# Patient Record
Sex: Female | Born: 1940 | Race: White | Hispanic: No | Marital: Married | Smoking: Current some day smoker
Health system: Southern US, Community
[De-identification: ages and names within clinical notes are randomized; demographics above are authoritative.]

## PROBLEM LIST (undated history)

## (undated) DIAGNOSIS — E785 Hyperlipidemia, unspecified: Secondary | ICD-10-CM

## (undated) DIAGNOSIS — N186 End stage renal disease: Secondary | ICD-10-CM

## (undated) DIAGNOSIS — J439 Emphysema, unspecified: Secondary | ICD-10-CM

## (undated) DIAGNOSIS — I629 Nontraumatic intracranial hemorrhage, unspecified: Secondary | ICD-10-CM

## (undated) DIAGNOSIS — I779 Disorder of arteries and arterioles, unspecified: Secondary | ICD-10-CM

## (undated) DIAGNOSIS — I5042 Chronic combined systolic (congestive) and diastolic (congestive) heart failure: Secondary | ICD-10-CM

## (undated) DIAGNOSIS — F0390 Unspecified dementia without behavioral disturbance: Secondary | ICD-10-CM

## (undated) DIAGNOSIS — F419 Anxiety disorder, unspecified: Secondary | ICD-10-CM

## (undated) DIAGNOSIS — J449 Chronic obstructive pulmonary disease, unspecified: Secondary | ICD-10-CM

## (undated) DIAGNOSIS — I639 Cerebral infarction, unspecified: Secondary | ICD-10-CM

## (undated) DIAGNOSIS — I272 Pulmonary hypertension, unspecified: Secondary | ICD-10-CM

## (undated) DIAGNOSIS — F32A Depression, unspecified: Secondary | ICD-10-CM

## (undated) DIAGNOSIS — I10 Essential (primary) hypertension: Secondary | ICD-10-CM

## (undated) DIAGNOSIS — E039 Hypothyroidism, unspecified: Secondary | ICD-10-CM

## (undated) DIAGNOSIS — I671 Cerebral aneurysm, nonruptured: Secondary | ICD-10-CM

## (undated) DIAGNOSIS — G9389 Other specified disorders of brain: Secondary | ICD-10-CM

## (undated) HISTORY — PX: CEREBRAL ANEURYSM REPAIR: SHX164

---

## 2020-01-13 ENCOUNTER — Inpatient Hospital Stay
Admission: EM | Admit: 2020-01-13 | Payer: Self-pay | Source: Other Acute Inpatient Hospital | Admitting: Family Medicine

## 2021-09-20 ENCOUNTER — Emergency Department (HOSPITAL_COMMUNITY): Payer: Medicare Other

## 2021-09-20 ENCOUNTER — Inpatient Hospital Stay (HOSPITAL_COMMUNITY)
Admission: EM | Admit: 2021-09-20 | Discharge: 2021-09-28 | DRG: 956 | Disposition: A | Discharge status: Home Health | Payer: Medicare Other | Attending: Internal Medicine | Admitting: Internal Medicine

## 2021-09-20 ENCOUNTER — Other Ambulatory Visit: Payer: Self-pay

## 2021-09-20 DIAGNOSIS — F0394 Unspecified dementia, unspecified severity, with anxiety: Secondary | ICD-10-CM | POA: Diagnosis present

## 2021-09-20 DIAGNOSIS — J439 Emphysema, unspecified: Secondary | ICD-10-CM | POA: Diagnosis present

## 2021-09-20 DIAGNOSIS — R9431 Abnormal electrocardiogram [ECG] [EKG]: Secondary | ICD-10-CM | POA: Diagnosis not present

## 2021-09-20 DIAGNOSIS — M25551 Pain in right hip: Secondary | ICD-10-CM | POA: Diagnosis present

## 2021-09-20 DIAGNOSIS — F32A Depression, unspecified: Secondary | ICD-10-CM | POA: Diagnosis present

## 2021-09-20 DIAGNOSIS — Z8249 Family history of ischemic heart disease and other diseases of the circulatory system: Secondary | ICD-10-CM

## 2021-09-20 DIAGNOSIS — R778 Other specified abnormalities of plasma proteins: Secondary | ICD-10-CM | POA: Diagnosis present

## 2021-09-20 DIAGNOSIS — G9341 Metabolic encephalopathy: Secondary | ICD-10-CM | POA: Diagnosis not present

## 2021-09-20 DIAGNOSIS — I132 Hypertensive heart and chronic kidney disease with heart failure and with stage 5 chronic kidney disease, or end stage renal disease: Secondary | ICD-10-CM | POA: Diagnosis present

## 2021-09-20 DIAGNOSIS — Z515 Encounter for palliative care: Secondary | ICD-10-CM | POA: Diagnosis not present

## 2021-09-20 DIAGNOSIS — I10 Essential (primary) hypertension: Secondary | ICD-10-CM | POA: Diagnosis present

## 2021-09-20 DIAGNOSIS — S72011A Unspecified intracapsular fracture of right femur, initial encounter for closed fracture: Principal | ICD-10-CM

## 2021-09-20 DIAGNOSIS — Z8679 Personal history of other diseases of the circulatory system: Secondary | ICD-10-CM

## 2021-09-20 DIAGNOSIS — F411 Generalized anxiety disorder: Secondary | ICD-10-CM | POA: Diagnosis present

## 2021-09-20 DIAGNOSIS — I5022 Chronic systolic (congestive) heart failure: Secondary | ICD-10-CM | POA: Diagnosis present

## 2021-09-20 DIAGNOSIS — Y9301 Activity, walking, marching and hiking: Secondary | ICD-10-CM | POA: Diagnosis present

## 2021-09-20 DIAGNOSIS — D631 Anemia in chronic kidney disease: Secondary | ICD-10-CM | POA: Diagnosis not present

## 2021-09-20 DIAGNOSIS — J9621 Acute and chronic respiratory failure with hypoxia: Secondary | ICD-10-CM | POA: Diagnosis present

## 2021-09-20 DIAGNOSIS — S066XAA Traumatic subarachnoid hemorrhage with loss of consciousness status unknown, initial encounter: Secondary | ICD-10-CM | POA: Diagnosis present

## 2021-09-20 DIAGNOSIS — E039 Hypothyroidism, unspecified: Secondary | ICD-10-CM | POA: Diagnosis present

## 2021-09-20 DIAGNOSIS — S72001A Fracture of unspecified part of neck of right femur, initial encounter for closed fracture: Secondary | ICD-10-CM | POA: Diagnosis present

## 2021-09-20 DIAGNOSIS — I739 Peripheral vascular disease, unspecified: Secondary | ICD-10-CM | POA: Diagnosis not present

## 2021-09-20 DIAGNOSIS — Z01810 Encounter for preprocedural cardiovascular examination: Secondary | ICD-10-CM

## 2021-09-20 DIAGNOSIS — I5042 Chronic combined systolic (congestive) and diastolic (congestive) heart failure: Secondary | ICD-10-CM | POA: Diagnosis present

## 2021-09-20 DIAGNOSIS — W010XXA Fall on same level from slipping, tripping and stumbling without subsequent striking against object, initial encounter: Secondary | ICD-10-CM | POA: Diagnosis present

## 2021-09-20 DIAGNOSIS — Z9181 History of falling: Secondary | ICD-10-CM

## 2021-09-20 DIAGNOSIS — I609 Nontraumatic subarachnoid hemorrhage, unspecified: Secondary | ICD-10-CM

## 2021-09-20 DIAGNOSIS — E785 Hyperlipidemia, unspecified: Secondary | ICD-10-CM | POA: Diagnosis present

## 2021-09-20 DIAGNOSIS — Z8673 Personal history of transient ischemic attack (TIA), and cerebral infarction without residual deficits: Secondary | ICD-10-CM

## 2021-09-20 DIAGNOSIS — I110 Hypertensive heart disease with heart failure: Secondary | ICD-10-CM | POA: Diagnosis not present

## 2021-09-20 DIAGNOSIS — Z20822 Contact with and (suspected) exposure to covid-19: Secondary | ICD-10-CM | POA: Diagnosis not present

## 2021-09-20 DIAGNOSIS — I272 Pulmonary hypertension, unspecified: Secondary | ICD-10-CM | POA: Diagnosis present

## 2021-09-20 DIAGNOSIS — J449 Chronic obstructive pulmonary disease, unspecified: Secondary | ICD-10-CM | POA: Diagnosis present

## 2021-09-20 DIAGNOSIS — I248 Other forms of acute ischemic heart disease: Secondary | ICD-10-CM | POA: Diagnosis not present

## 2021-09-20 DIAGNOSIS — W19XXXA Unspecified fall, initial encounter: Secondary | ICD-10-CM

## 2021-09-20 DIAGNOSIS — S72001D Fracture of unspecified part of neck of right femur, subsequent encounter for closed fracture with routine healing: Secondary | ICD-10-CM | POA: Diagnosis not present

## 2021-09-20 DIAGNOSIS — S01111A Laceration without foreign body of right eyelid and periocular area, initial encounter: Secondary | ICD-10-CM | POA: Diagnosis present

## 2021-09-20 DIAGNOSIS — Z7189 Other specified counseling: Secondary | ICD-10-CM | POA: Diagnosis not present

## 2021-09-20 DIAGNOSIS — D72829 Elevated white blood cell count, unspecified: Secondary | ICD-10-CM | POA: Diagnosis present

## 2021-09-20 DIAGNOSIS — G40909 Epilepsy, unspecified, not intractable, without status epilepticus: Secondary | ICD-10-CM | POA: Diagnosis present

## 2021-09-20 DIAGNOSIS — F0390 Unspecified dementia without behavioral disturbance: Secondary | ICD-10-CM | POA: Diagnosis not present

## 2021-09-20 DIAGNOSIS — Y92009 Unspecified place in unspecified non-institutional (private) residence as the place of occurrence of the external cause: Secondary | ICD-10-CM

## 2021-09-20 DIAGNOSIS — I7123 Aneurysm of the descending thoracic aorta, without rupture: Secondary | ICD-10-CM | POA: Diagnosis present

## 2021-09-20 DIAGNOSIS — E889 Metabolic disorder, unspecified: Secondary | ICD-10-CM | POA: Diagnosis present

## 2021-09-20 DIAGNOSIS — Z992 Dependence on renal dialysis: Secondary | ICD-10-CM

## 2021-09-20 DIAGNOSIS — G9389 Other specified disorders of brain: Secondary | ICD-10-CM | POA: Diagnosis present

## 2021-09-20 DIAGNOSIS — E782 Mixed hyperlipidemia: Secondary | ICD-10-CM | POA: Diagnosis not present

## 2021-09-20 DIAGNOSIS — Z720 Tobacco use: Secondary | ICD-10-CM | POA: Diagnosis present

## 2021-09-20 DIAGNOSIS — Z01818 Encounter for other preprocedural examination: Secondary | ICD-10-CM | POA: Diagnosis not present

## 2021-09-20 DIAGNOSIS — I509 Heart failure, unspecified: Secondary | ICD-10-CM | POA: Diagnosis not present

## 2021-09-20 DIAGNOSIS — F17210 Nicotine dependence, cigarettes, uncomplicated: Secondary | ICD-10-CM | POA: Diagnosis present

## 2021-09-20 DIAGNOSIS — I712 Thoracic aortic aneurysm, without rupture, unspecified: Secondary | ICD-10-CM | POA: Diagnosis not present

## 2021-09-20 DIAGNOSIS — N186 End stage renal disease: Secondary | ICD-10-CM | POA: Diagnosis present

## 2021-09-20 DIAGNOSIS — I719 Aortic aneurysm of unspecified site, without rupture: Secondary | ICD-10-CM | POA: Diagnosis present

## 2021-09-20 DIAGNOSIS — R569 Unspecified convulsions: Secondary | ICD-10-CM | POA: Diagnosis not present

## 2021-09-20 HISTORY — DX: End stage renal disease: N18.6

## 2021-09-20 HISTORY — DX: Emphysema, unspecified: J43.9

## 2021-09-20 HISTORY — DX: Hypothyroidism, unspecified: E03.9

## 2021-09-20 HISTORY — DX: Depression, unspecified: F32.A

## 2021-09-20 HISTORY — DX: Cerebral infarction, unspecified: I63.9

## 2021-09-20 HISTORY — DX: Hyperlipidemia, unspecified: E78.5

## 2021-09-20 HISTORY — DX: Cerebral aneurysm, nonruptured: I67.1

## 2021-09-20 HISTORY — DX: Chronic combined systolic (congestive) and diastolic (congestive) heart failure: I50.42

## 2021-09-20 HISTORY — DX: Anxiety disorder, unspecified: F41.9

## 2021-09-20 HISTORY — DX: Disorder of arteries and arterioles, unspecified: I77.9

## 2021-09-20 HISTORY — DX: Unspecified dementia, unspecified severity, without behavioral disturbance, psychotic disturbance, mood disturbance, and anxiety: F03.90

## 2021-09-20 HISTORY — DX: Chronic obstructive pulmonary disease, unspecified: J44.9

## 2021-09-20 HISTORY — DX: Essential (primary) hypertension: I10

## 2021-09-20 HISTORY — DX: Nontraumatic intracranial hemorrhage, unspecified: I62.9

## 2021-09-20 HISTORY — DX: Pulmonary hypertension, unspecified: I27.20

## 2021-09-20 HISTORY — DX: Other specified disorders of brain: G93.89

## 2021-09-20 LAB — CBC
HCT: 34.2 % — ABNORMAL LOW (ref 36.0–46.0)
HCT: 33.2 % — ABNORMAL LOW (ref 36.0–46.0)
Hemoglobin: 11.1 g/dL — ABNORMAL LOW (ref 12.0–15.0)
Hemoglobin: 10.7 g/dL — ABNORMAL LOW (ref 12.0–15.0)
MCH: 31.2 pg (ref 26.0–34.0)
MCH: 31.5 pg (ref 26.0–34.0)
MCHC: 32.5 g/dL (ref 30.0–36.0)
MCHC: 32.2 g/dL (ref 30.0–36.0)
MCV: 96.1 fL (ref 80.0–100.0)
MCV: 97.6 fL (ref 80.0–100.0)
Platelets: 186 10*3/uL (ref 150–400)
Platelets: 174 10*3/uL (ref 150–400)
RBC: 3.56 MIL/uL — ABNORMAL LOW (ref 3.87–5.11)
RBC: 3.4 MIL/uL — ABNORMAL LOW (ref 3.87–5.11)
RDW: 14.4 % (ref 11.5–15.5)
RDW: 14.3 % (ref 11.5–15.5)
WBC: 10.2 10*3/uL (ref 4.0–10.5)
WBC: 15.2 10*3/uL — ABNORMAL HIGH (ref 4.0–10.5)
nRBC: 0 % (ref 0.0–0.2)
nRBC: 0 % (ref 0.0–0.2)

## 2021-09-20 LAB — SAMPLE TO BLOOD BANK

## 2021-09-20 LAB — I-STAT CHEM 8, ED
BUN: 22 mg/dL (ref 8–23)
Calcium, Ion: 1.08 mmol/L — ABNORMAL LOW (ref 1.15–1.40)
Chloride: 102 mmol/L (ref 98–111)
Creatinine, Ser: 3.8 mg/dL — ABNORMAL HIGH (ref 0.44–1.00)
Glucose, Bld: 95 mg/dL (ref 70–99)
HCT: 35 % — ABNORMAL LOW (ref 36.0–46.0)
Hemoglobin: 11.9 g/dL — ABNORMAL LOW (ref 12.0–15.0)
Potassium: 4.8 mmol/L (ref 3.5–5.1)
Sodium: 140 mmol/L (ref 135–145)
TCO2: 30 mmol/L (ref 22–32)

## 2021-09-20 LAB — MAGNESIUM: Magnesium: 1.9 mg/dL (ref 1.7–2.4)

## 2021-09-20 LAB — RESP PANEL BY RT-PCR (FLU A&B, COVID) ARPGX2
Influenza A by PCR: NEGATIVE
Influenza B by PCR: NEGATIVE
SARS Coronavirus 2 by RT PCR: NEGATIVE

## 2021-09-20 LAB — DIFFERENTIAL
Abs Immature Granulocytes: 0.1 10*3/uL — ABNORMAL HIGH (ref 0.00–0.07)
Basophils Absolute: 0.1 10*3/uL (ref 0.0–0.1)
Basophils Relative: 0 %
Eosinophils Absolute: 0.1 10*3/uL (ref 0.0–0.5)
Eosinophils Relative: 1 %
Immature Granulocytes: 1 %
Lymphocytes Relative: 3 %
Lymphs Abs: 0.4 10*3/uL — ABNORMAL LOW (ref 0.7–4.0)
Monocytes Absolute: 0.7 10*3/uL (ref 0.1–1.0)
Monocytes Relative: 5 %
Neutro Abs: 13.9 10*3/uL — ABNORMAL HIGH (ref 1.7–7.7)
Neutrophils Relative %: 90 %

## 2021-09-20 LAB — PHOSPHORUS: Phosphorus: 4.1 mg/dL (ref 2.5–4.6)

## 2021-09-20 LAB — TROPONIN I (HIGH SENSITIVITY): Troponin I (High Sensitivity): 39 ng/L — ABNORMAL HIGH (ref <18)

## 2021-09-20 LAB — COMPREHENSIVE METABOLIC PANEL
ALT: 12 U/L (ref 0–44)
AST: 36 U/L (ref 15–41)
Albumin: 3.3 g/dL — ABNORMAL LOW (ref 3.5–5.0)
Alkaline Phosphatase: 93 U/L (ref 38–126)
Anion gap: 11 (ref 5–15)
BUN: 17 mg/dL (ref 8–23)
CO2: 26 mmol/L (ref 22–32)
Calcium: 9 mg/dL (ref 8.9–10.3)
Chloride: 103 mmol/L (ref 98–111)
Creatinine, Ser: 3.84 mg/dL — ABNORMAL HIGH (ref 0.44–1.00)
GFR, Estimated: 11 mL/min — ABNORMAL LOW (ref >60)
Glucose, Bld: 93 mg/dL (ref 70–99)
Potassium: 4.9 mmol/L (ref 3.5–5.1)
Sodium: 140 mmol/L (ref 135–145)
Total Bilirubin: 1.1 mg/dL (ref 0.3–1.2)
Total Protein: 6 g/dL — ABNORMAL LOW (ref 6.5–8.1)

## 2021-09-20 LAB — PROTIME-INR
INR: 1.2 (ref 0.8–1.2)
Prothrombin Time: 15 seconds (ref 11.4–15.2)

## 2021-09-20 LAB — CK: Total CK: 19 U/L — ABNORMAL LOW (ref 38–234)

## 2021-09-20 LAB — ETHANOL: Alcohol, Ethyl (B): <10 mg/dL (ref <10)

## 2021-09-20 LAB — LACTIC ACID, PLASMA: Lactic Acid, Venous: 1.1 mmol/L (ref 0.5–1.9)

## 2021-09-20 MED ORDER — LIDOCAINE HCL (PF) 1 % IJ SOLN
30.0000 mL | Freq: Once | INTRAMUSCULAR | Status: AC
  Administered 2021-09-20: 30 mL via INTRADERMAL
  Filled 2021-09-20: qty 30

## 2021-09-20 MED ORDER — IOHEXOL 300 MG/ML  SOLN
100.0000 mL | Freq: Once | INTRAMUSCULAR | Status: AC | PRN
Start: 2021-09-20 — End: 2021-09-20
  Administered 2021-09-20: 100 mL via INTRAVENOUS

## 2021-09-20 MED ORDER — ONDANSETRON HCL 4 MG/2ML IJ SOLN
4.0000 mg | Freq: Once | INTRAMUSCULAR | Status: AC
Start: 2021-09-20 — End: 2021-09-20
  Administered 2021-09-20: 4 mg via INTRAVENOUS
  Filled 2021-09-20: qty 2

## 2021-09-20 MED ORDER — MORPHINE SULFATE (PF) 2 MG/ML IV SOLN
2.0000 mg | INTRAVENOUS | Status: DC | PRN
  Administered 2021-09-21: 2 mg via INTRAVENOUS
  Filled 2021-09-20: qty 1

## 2021-09-20 MED ORDER — MORPHINE SULFATE (PF) 4 MG/ML IV SOLN
4.0000 mg | Freq: Once | INTRAVENOUS | Status: AC
  Administered 2021-09-20: 4 mg via INTRAVENOUS
  Filled 2021-09-20: qty 1

## 2021-09-20 NOTE — Progress Notes (Signed)
Received call regarding this patient with ground level fall. Patient with right femoral neck fracture. Will require arthroplasty surgery. NPO after midnight. Hospitalist admission. Plan for surgery tomorrow pending surgeon and OR availability. ? ?Shona Needles, MD ?[facility] ?[phone] (office) ?[email or web address] ? ?

## 2021-09-20 NOTE — Progress Notes (Signed)
Orthopedic Tech Progress Note ?Patient Details:  ?Katherine Ponce ?Oct 22, 1940 ?[id number] ? ?Level 2 trauma  ? ?Patient ID: Katherine Ponce, female   DOB: 1940-10-27, 81 y.o.   MRN: [id number] ? ?Katherine Ponce ?09/20/2021, 6:36 PM ? ?

## 2021-09-20 NOTE — H&P (View-Only) (Signed)
Received call regarding this patient with ground level fall. Patient with right femoral neck fracture. Will require arthroplasty surgery. NPO after midnight. Hospitalist admission. Plan for surgery tomorrow pending surgeon and OR availability. ? ?Shona Needles, MD ?[facility] ?[phone] (office) ?[email or web address] ? ?

## 2021-09-20 NOTE — ED Triage Notes (Signed)
Pt BIB [facility] from home as fall-Level 2 with Head injury. The neighbor went to her home & saw husband tending to her in the floor, unknown down time or if any LOC. Head injury apparent with Lac to Rt temporal area & Skin tear to the Right FA. Pt has Hx of dementia but husband reports she can usually carry on a full conversation, she was lethargic while in route to ED. EMS reports c-collar in place, initial BP 150 palpated, then 198/100, 20g PIV in Lt AC started & 4mg  Zofran given d/t n/v x1 occurrence. Dialysis pt- Fistula in Rt arm (goes on Tue, Thur & Sat). Pt denies any pain. ?

## 2021-09-20 NOTE — Subjective & Objective (Signed)
Pt with dementia had a fall with head injury ?The neighbor went to her home & saw husband tending to her in the floor, unknown down time or if any LOC. Head injury apparent with Lac to Rt temporal area & Skin tear to the Right FA. Pt has Hx of dementia but husband reports she can usually carry on a full conversation, she was lethargic  ?Pt is on HD goes T H Sat ?

## 2021-09-20 NOTE — ED Provider Notes (Signed)
?[facility] ?Provider Note ? ?CSN: [id number] ?Arrival date & time: 09/20/21 1800 ? ?Chief Complaint(s) ?No chief complaint on file. ? ?HPI ?Katherine Ponce is a 81 y.o. female who presents emergency department as a level 2 trauma for a fall from standing with external evidence of head trauma.  Patient is not on a blood thinner.  Patient arrives with bruising to bilateral orbits, a laceration to the lateral aspect of the right orbit and complaints of right hip pain.  Additional history obtained from patient's family who states that the patient attempted to maneuver around a chair and tripped and struck her face on the ground.  Patient alert and answering questions on arrival.  Hemodynamically stable. ? ?HPI ? ?Past Medical History ?No past medical history on file. ?There are no problems to display for this patient. ? ?Home Medication(s) ?Prior to Admission medications   ?Not on File  ?                                                                                                                                  ?Past Surgical History ? ?Family History ?No family history on file. ? ?Social History ?  ?Allergies ?Patient has no allergy information on record. ? ?Review of Systems ?Review of Systems  ?HENT:  Positive for facial swelling.   ?Musculoskeletal:  Positive for arthralgias.  ?Skin:  Positive for wound.  ? ?Physical Exam ?Vital Signs  ?I have reviewed the triage vital signs ?There were no vitals taken for this visit. ? ?Physical Exam ?Vitals and nursing note reviewed.  ?Constitutional:   ?   General: She is not in acute distress. ?   Appearance: She is well-developed.  ?HENT:  ?   Head: Normocephalic.  ?   Comments: 1 cm laceration to the right lateral orbit, bilateral orbital bruising ?Eyes:  ?   Conjunctiva/sclera: Conjunctivae normal.  ?Cardiovascular:  ?   Rate and Rhythm: Normal rate and regular rhythm.  ?   Heart sounds: No murmur heard. ?Pulmonary:  ?   Effort:  Pulmonary effort is normal. No respiratory distress.  ?   Breath sounds: Normal breath sounds.  ?Abdominal:  ?   Palpations: Abdomen is soft.  ?   Tenderness: There is no abdominal tenderness.  ?Musculoskeletal:     ?   General: Tenderness (Right hip) and deformity present. No swelling.  ?   Cervical back: Neck supple.  ?Skin: ?   General: Skin is warm and dry.  ?   Capillary Refill: Capillary refill takes less than 2 seconds.  ?Neurological:  ?   Mental Status: She is alert.  ?Psychiatric:     ?   Mood and Affect: Mood normal.  ? ? ?ED Results and Treatments ?Labs ?(all labs ordered are listed, but only abnormal results are displayed) ?Labs Reviewed  ?RESP PANEL BY RT-PCR (FLU A&B, COVID) ARPGX2  ?COMPREHENSIVE METABOLIC PANEL  ?CBC  ?  ETHANOL  ?URINALYSIS, ROUTINE W REFLEX MICROSCOPIC  ?LACTIC ACID, PLASMA  ?PROTIME-INR  ?I-STAT CHEM 8, ED  ?SAMPLE TO BLOOD BANK  ?                                                                                                                       ? ?Radiology ?No results found. ? ?Pertinent labs & imaging results that were available during my care of the patient were reviewed by me and considered in my medical decision making (see MDM for details). ? ?Medications Ordered in ED ?Medications - No data to display                                                               ?                                                                    ?Procedures ?Marland KitchenCritical Care ?Performed by: Teressa Lower, MD ?Authorized by: Teressa Lower, MD  ? ?Critical care provider statement:  ?  Critical care time (minutes):  30 ?  Critical care was necessary to treat or prevent imminent or life-threatening deterioration of the following conditions:  Trauma ?  Critical care was time spent personally by me on the following activities:  Development of treatment plan with patient or surrogate, discussions with consultants, evaluation of patient's response to treatment, examination of patient, ordering  and review of laboratory studies, ordering and review of radiographic studies, ordering and performing treatments and interventions, pulse oximetry, re-evaluation of patient's condition and review of old charts ?Marland Kitchen.Laceration Repair ? ?Date/Time: 09/20/2021 11:26 PM ?Performed by: Teressa Lower, MD ?Authorized by: Teressa Lower, MD  ? ?Laceration details:  ?  Location:  Face ?  Face location:  R eyebrow ?  Length (cm):  1 ?Pre-procedure details:  ?  Preparation:  Patient was prepped and draped in usual sterile fashion ?Treatment:  ?  Area cleansed with:  Saline ?  Amount of cleaning:  Standard ?  Irrigation method:  Pressure wash ?Skin repair:  ?  Repair method:  Sutures ?  Suture size:  3-0 ?  Suture material:  Fast-absorbing gut ?  Suture technique:  Simple interrupted ?Approximation:  ?  Approximation:  Close ?Repair type:  ?  Repair type:  Simple ?Post-procedure details:  ?  Dressing:  Adhesive bandage ?  Procedure completion:  Tolerated well, no immediate complications ? ?(including critical care time) ? ?Medical Decision Making / ED Course ? ? ?This patient presents to the ED for concern of right hip  pain, facial trauma, this involves an extensive number of treatment options, and is a complaint that carries with it a high risk of complications and morbidity.  The differential diagnosis includes fracture, ICH, skull fracture, temporal bone fracture ? ?MDM: ?Patient seen in the emergency room for evaluation of facial trauma and right hip pain after fall.  Initial primary survey negative.  Secondary survey with bilateral orbital bruising, laceration to the right orbit, tenderness over the right hip but is otherwise unremarkable.  Laboratory evaluation with a hemoglobin of 11.1 but is otherwise unremarkable.  Trauma imaging with trace subarachnoid hemorrhage, right hip fracture as well as an incidental large descending thoracic aneurysm with no evidence of rupture.  Neurosurgery was consulted about the  subarachnoid hemorrhage and they state that this is exceptionally small and does not require follow-up imaging.  Orthopedics consulted who recommended medical admission and they will fix the hip tomorrow.  Vascular surgery consulted who evaluated the patient at bedside and states that she has a very high risk surgical candidate given her ESRD on hemodialysis and they will be reluctant to perform high risk surgery on this unless it is symptomatic.  Patient then admitted to the medical service for observation and hip fracture repair tomorrow orthopedics. ? ? ?Additional history obtained: ?-Additional history obtained from family members ?-External records from outside source obtained and reviewed including: Chart review including previous notes, labs, imaging, consultation notes ? ? ?Lab Tests: ?-I ordered, reviewed, and interpreted labs.   ?The pertinent results include:   ?Labs Reviewed  ?RESP PANEL BY RT-PCR (FLU A&B, COVID) ARPGX2  ?COMPREHENSIVE METABOLIC PANEL  ?CBC  ?ETHANOL  ?URINALYSIS, ROUTINE W REFLEX MICROSCOPIC  ?LACTIC ACID, PLASMA  ?PROTIME-INR  ?I-STAT CHEM 8, ED  ?SAMPLE TO BLOOD BANK  ?  ? ? ?EKG  ? EKG Interpretation ? ?Date/Time:  Tuesday September 20 2021 22:19:06 EDT ?Ventricular Rate:  81 ?PR Interval:  253 ?QRS Duration: 115 ?QT Interval:  417 ?QTC Calculation: 485 ?R Axis:   -8 ?Text Interpretation: Sinus rhythm Prolonged PR interval LVH with IVCD and secondary repol abnrm Anterior ST elevation, probably due to LVH Confirmed by Kyrollos Cordell ([id number]) on 09/20/2021 11:27:59 PM ?  ? ?  ? ? ? ?Imaging Studies ordered: ?I ordered imaging studies including CT head, C-spine, chest abdomen pelvis, temporal bones ?I independently visualized and interpreted imaging. ?I agree with the radiologist interpretation ? ? ?Medicines ordered and prescription drug management: ?No orders of the defined types were placed in this encounter. ?  ?-I have reviewed the patients home medicines and have made adjustments as  needed ? ?Critical interventions ?Multiple consultations, trauma resuscitation ? ?Consultations Obtained: ?I requested consultation with the orthopedic surgeon vascular surgeon neurosurgeon,  and discussed lab and imaging

## 2021-09-20 NOTE — H&P (Signed)
? ? Katherine Ponce LGX:[id number] DOB: 1940/09/08 DOA: 09/20/2021 ?  ?PCP: Kristopher Glee., MD   ?Outpatient Specialists:  ?  ?NEphrology:   Dr.Igwemezie, Lucile Crater, MD ?   ?Patient arrived to ER on 09/20/21 at 1800 ?Referred by Attending Kommor, Debe Coder, MD ? ? ?Patient coming from:   ? home Lives  With family ?   ?Chief Complaint:   ?Chief Complaint  ?Patient presents with  ? Trauma  ? Level 2  ? Head Injury  ? ? ?HPI: ?Katherine Ponce is a 81 y.o. 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   ?Presented with a mechanical  fall  ?Pt with dementia had a fall with head injury ?The neighbor went to her home & saw husband tending to her in the floor, unknown down time or if any LOC. Head injury apparent with Lac to Rt temporal area & Skin tear to the Right FA. Pt has Hx of dementia but husband reports she can usually carry on a full conversation, she was lethargic  ?Pt is on HD goes T H Sat ?  ?Per family pt supposed to walk with a walker  ?But was confused walked around it and fell down ?She smokes on occasion ?Walks with walker ?At baseline recognizes husband ?Short term memory loss ?No CP no SOB ?Unable to waLK AT BASEline to mailbox, or up the stairs ? ? Initial COVID TEST  ?NEGATIVE  ? ?Lab Results  ?Component Value Date  ? [place] NEGATIVE 09/20/2021  ? ?  ?Regarding pertinent Chronic problems:   ? ? Hyperlipidemia -  on statins Atorvastatin. ?Lipid Panel  ?No results found for: CHOL, TRIG, HDL, CHOLHDL, VLDL, LDLCALC, LDLDIRECT, LABVLDL ? ? HTN on Hydralazine, labetalol, torsemide, Nifedipine. ? ? chronic CHF diastolic/systolic/ combined - last echo  ?LV ejection fraction = 40-45%. 01/2021 ?IVC size was severely dilated.  ?  ?GAD on Sertraline 100 1 and 1/2 tablet po daily.  ?  ? ? Hypothyroidism:  on synthroid ?   ?COPD - not followed by pulmonology  not  on baseline oxygen  ?  ?Hx of CVA - with/out residual  deficits on Aspirin 81 mg,  ?  ?ESRD on HD tu th,sat ?Estimated Creatinine Clearance: 11.4 mL/min (A) (by C-G formula based on SCr of 3.8 mg/dL (H)). ? ?Lab Results  ?Component Value Date  ? CREATININE 3.80 (H) 09/20/2021  ? CREATININE 3.84 (H) 09/20/2021  ? ? ? Chronic anemia - baseline hg Hemoglobin & Hematocrit  ?Recent Labs  ?  09/20/21 ?1809 09/20/21 ?1817  ?HGB 11.1* 11.9*  ? ?While in ER: ?   ?Ordered ? ?CT HEAD Very small amount of subarachnoid blood over the right convexity. ?2. Right frontal scalp hematoma without underlying fracture. ? ?CXR - There is marked ectasia of the descending thoracic aorta which ?appears more pronounced, although this may be rotational. Recommend ?further evaluation with CT. ?2. Cardiomegaly with central pulmonary vascular congestion. ?3. Minimal left basilar atelectasis/airspace disease. ? ?CTabd/pelvis -   ? ?CTA chest - 1. 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 Recommend vascular ?consultation. ?2. Small bilateral pleural effusions. ?3. Bibasilar atelectasis. ?4. Displaced subcapital right femoral neck fracture. ?5. Cardiomegaly. ?6. Trace free fluid in the pelvis. ?7. Cholelithiasis. ?8. Mildly hyperdense renal lesions are indeterminate, possibly ?hyperdense cyst. Recommend further evaluation with ultrasound or ?MRI. ? ?  Right hip Transverse subcapital acute fracture of the right proximal femur ?with varus angulation. ? ?Following Medications were ordered in ER: ?Medications  ?iohexol (OMNIPAQUE) 300 MG/ML solution 100 mL (100 mLs Intravenous Contrast Given 09/20/21 1915)  ?morphine (PF) 4 MG/ML injection 4 mg (4 mg Intravenous Given 09/20/21 1953)  ?ondansetron [facility]) injection 4 mg (4 mg Intravenous Given 09/20/21 1949)  ?lidocaine (PF) (XYLOCAINE) 1 % injection 30 mL (30 mLs Intradermal Given 09/20/21 2055)  ?  ?_______________________________________________________ ?ER Provider Called:   vascular  Dr. Scot Dock ?They  Recommend admit to medicine  Given that the aneurysm extends to the level of the celiac axis she would not be a candidate for standard thoracic stent.  I think her only option would be a custom fenestrated graft or a open thoracoabdominal repair.  Both of these would be associated with significant risk.  If the family wished to pursue this electively we would need to refer the patient to [facility] to be evaluated by Dr. Eddie Dibbles team.  Vascular surgery will follow. ? SEEN in ER ?_______________________________________________________ ?ER Provider Called:  orthopedics    Dr. Jari Pigg ?They Recommend admit to medicine Patient with right femoral neck fracture. Will require arthroplasty surgery. NPO after midnight. Hospitalist admission. Plan for surgery tomorrow pending surgeon and OR availability. ?Will see in AM    ? ?_______________________________________________________ ?ER Provider Called:  neurosurgery     Dr.Cabbell ?They Recommend admit to medicine Head CT reviewed. There is a miniscule  amount of possible blood in the subarachnoid space. This is causing no problems. No repeat ct is needed. No further follow up is needed ?  ?  ?ED Triage Vitals  ?Enc Vitals Group  ?   BP 09/20/21 1803 (!) 194/86  ?   Pulse Rate 09/20/21 1815 70  ?   Resp 09/20/21 1815 (!) 29  ?   Temp --   ?   Temp src --   ?   SpO2 09/20/21 1815 91 %  ?   Weight 09/20/21 1816 130 lb (59 kg)  ?   Height 09/20/21 1816 5\' 6"  (1.676 m)  ?   Head Circumference --   ?   Peak Flow --   ?   Pain Score 09/20/21 1816 0  ?   Pain Loc --   ?   Pain Edu? --   ?   Excl. in [place]? --   ?VOHY(07)@    ? _________________________________________ ?Significant initial  Findings: ?Abnormal Labs Reviewed  ?COMPREHENSIVE METABOLIC PANEL - Abnormal; Notable for the following components:  ?    Result Value  ? Creatinine, Ser 3.84 (*)   ? Total Protein 6.0 (*)   ? Albumin 3.3 (*)   ? GFR, Estimated 11 (*)   ? All other components within normal limits  ?CBC - Abnormal;  Notable for the following components:  ? RBC 3.56 (*)   ? Hemoglobin 11.1 (*)   ? HCT 34.2 (*)   ? All other components within normal limits  ?I-STAT CHEM 8, ED - Abnormal; Notable for the following components:  ? Creatinine, Ser 3.80 (*)   ? Calcium, Ion 1.08 (*)   ? Hemoglobin 11.9 (*)   ? HCT 35.0 (*)   ? All other components within normal limits  ?_________________________ ?Troponin 39 ?ECG: Ordered ?Personally reviewed by me showing: ?HR :81  ?Rhythm: SR, LVH ? Anterior ST elevation  ?QTC 485 ? ?The recent clinical data is shown below. ?Vitals:  ? 09/20/21 2045 09/20/21 2100  09/20/21 2115 09/20/21 2130  ?BP: (!) 187/95 (!) 195/84 (!) 186/86 (!) 196/86  ?Pulse: 84     ?Resp: 17 20 20  (!) 24  ?SpO2: 97%     ?Weight:      ?Height:      ? ?  ?WBC ? ?   ?Component Value Date/Time  ? WBC 10.2 09/20/2021 1809  ? ? Lactic Acid, Venous ?   ?Component Value Date/Time  ? LATICACIDVEN 1.1 09/20/2021 1809  ?  ?Results for orders placed or performed during the hospital encounter of 09/20/21  ?Resp Panel by RT-PCR (Flu A&B, Covid) Nasopharyngeal Swab     Status: None  ? Collection Time: 09/20/21  7:23 PM  ? Specimen: Nasopharyngeal Swab; Nasopharyngeal(NP) swabs in vial transport medium  ?Result Value Ref Range Status  ? SARS Coronavirus 2 by RT PCR NEGATIVE NEGATIVE Final  ?     ? Influenza A by PCR NEGATIVE NEGATIVE Final  ? Influenza B by PCR NEGATIVE NEGATIVE Final  ?     ? ?_______________________________________________ ?Hospitalist was called for admission for   ? ? ?The following Work up has been ordered so far: ? ?Orders Placed This Encounter  ?Procedures  ? Resp Panel by RT-PCR (Flu A&B, Covid) Nasopharyngeal Swab  ? DG Chest Port 1 View  ? DG Pelvis Portable  ? CT HEAD WO CONTRAST  ? CT Temporal Bones Wo Contrast  ? CT Maxillofacial Wo Contrast  ? CT CHEST ABDOMEN PELVIS W CONTRAST  ? CT Cervical Spine Wo Contrast  ? DG Hip Unilat W or Wo Pelvis 2-3 Views Right  ? US THYROID  ? Comprehensive metabolic panel  ? CBC   ? Ethanol  ? Urinalysis, Routine w reflex microscopic  ? Lactic acid, plasma  ? Protime-INR  ? Diet NPO time specified  ? Cardiac monitoring  ? Measure blood pressure  ? Initiate Carrier Fluid Protocol

## 2021-09-20 NOTE — ED Notes (Signed)
Trauma Event Note ? ?TRN at bedside, assumed care at shift change from [facility]. Met patient in CT and escorted back to treatment room, updated spouse at bedside. Consults to cardiothoracic, neurosurg and vascular in process. Anticipate ortho consult for right hip fracture.  ? ? ?TRN Arrival Time:1900 ?End Time:1920 ? ?Last imported Vital Signs ?BP (!) 184/80   Pulse 70   Resp (!) 29   Ht 5' 6"  (1.676 m)   Wt 130 lb (59 kg)   SpO2 91%   BMI 20.98 kg/m?  ? ?Trending CBC ?Recent Labs  ?  09/20/21 ?1809 09/20/21 ?1817  ?WBC 10.2  --   ?HGB 11.1* 11.9*  ?HCT 34.2* 35.0*  ?PLT 186  --   ? ? ?Trending Coag's ?Recent Labs  ?  09/20/21 ?1809  ?INR 1.2  ? ? ?Trending BMET ?Recent Labs  ?  09/20/21 ?1817  ?NA 140  ?K 4.8  ?CL 102  ?BUN 22  ?CREATININE 3.80*  ?GLUCOSE 95  ? ? ? ? ?[place]  ?Trauma Response RN ? ?Please call TRN at [phone] for further assistance. ? ? ?  ?

## 2021-09-20 NOTE — Progress Notes (Signed)
Patient ID: Katherine Ponce, female   DOB: Jul 07, 1940, 81 y.o.   MRN: [id number] ?BP (!) 235/90   Pulse 83   Resp (!) 27   Ht 5\' 6"  (1.676 m)   Wt 59 kg   SpO2 97%   BMI 20.98 kg/m?  ?Head CT reviewed. There is a miniscule  amount of possible blood in the subarachnoid space. This is causing no problems. No repeat ct is needed. No further follow up is needed. History of aneurysms changes nothing at this time.   ?

## 2021-09-20 NOTE — ED Notes (Signed)
Trauma Response Nurse Documentation ? ? ?Katherine Ponce is a 81 y.o. female arriving to [facility] ED via EMS ? ?On No antithrombotic. Trauma was activated as a Level 2 by ED Charge RN based on the following trauma criteria GCS 10-14 associated with trauma or AVPU < A. Fall with significant facial trauma. Trauma RN at the bedside on patient arrival. Patient cleared for CT by Dr. Matilde Sprang. Patient to CT with team. GCS 14. ? ?History  ? No past medical history on file.  ?   ? ?Initial Focused Assessment (If applicable, or please see trauma documentation): ?- GCS 14 ?- PERRLA ?- C-collar in place ?- 20G to L AC ?- Significant bleeding to R side of head ?- ecchymosis bilateral eyes ?- swelling to R cheek  ?- indentation to R temporal bone ?- baseline dementia ? ?CT's Completed:   ?CT Head, CT Maxillofacial, and CT C-Spine  ? ?Interventions:  ?- CXR ?- Pelvic XR ?- CT pan scan ?- trauma labs ?- 2nd PIV 20g to L wrist ?- [place] J c-collar placed ?- Head cleaned with peroxide to assess bleeding site. ? ?Plan for disposition:  ?Other : unsure at this time.  Awaiting scan results ? ?Consults completed:  ?none at 1850. ? ?Event Summary: ?BIB [facility] EMS as L2 head trauma.  According to EMS, the neighbor went to her house and saw her husband tending to her on the floor.  UNK how long she was down or if there was any LOC.  Pt has hx of dementia but can normally carry on a conversation.  Pt is aware that she is at the hospital at this time.  Had some nausea/vomiting on the way, 4mg  of zofran given. Pt does dialysis T,Th, Sat and fistula to R arm.  Pt is more concerned about her little dog at this time however she cannot remember what it's name is. ? ?MTP Summary (If applicable): n/a ? ?Bedside handoff with ED RN Arby Barrette.   ? ?Dulcy Fanny W  ?Trauma Response RN ? ?Please call TRN at [phone] for further assistance. ? ? ?

## 2021-09-20 NOTE — ED Notes (Signed)
Patient transported to CT with Trauma RN to scanner 2. ?

## 2021-09-20 NOTE — ED Notes (Signed)
X-ray at bedside

## 2021-09-20 NOTE — Consult Note (Signed)
? ?ASSESSMENT & PLAN  ? 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  This was an incidental finding and is asymptomatic.  She will be 81 in 2 days.  She has end-stage renal disease.  Given that the aneurysm extends to the level of the celiac axis she would not be a candidate for standard thoracic stent.  I think her only option would be a custom fenestrated graft or a open thoracoabdominal repair.  Both of these would be associated with significant risk.  If the family wished to pursue this electively we would need to refer the patient to [facility] to be evaluated by Dr. Eddie Dibbles team.  Vascular surgery will follow. ? ?REASON FOR CONSULT:   ? ?Incidental finding of a thoracic aortic aneurysm.  The consult is requested by Dr. Matilde Sprang.  ? ?HPI:  ? ?Katherine Ponce is a 81 y.o. female (she will be 38 in 2 days) who fell earlier today injuring right side of her face and sustaining a right femoral neck fracture.  She is scheduled for surgery tomorrow with orthopedics to address the femoral neck fracture.  Her work-up included a CT angio of the chest abdomen and pelvis and an incidental finding was a 6.9 cm aneurysm of the distal descending thoracic aorta.  For this reason vascular surgery was consulted. ? ?On my history she was unaware that she had an aneurysm.  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  She denies abdominal pain or back pain. ? ?Her past medical history is significant for end-stage renal disease.  She dialyzes on Tuesdays Thursdays and Saturdays. ? ?Her risk factors for peripheral arterial disease include hypertension and tobacco use.  She smokes 4 to 5 cigarettes a day.  She denies any history of diabetes or hypercholesterolemia.  She denies any family history of aneurysmal disease or premature cardiovascular disease. ? ?She  denies any history of claudication or rest pain. ? ?No past medical history on file. ? ?No family history on file. ? ?SOCIAL HISTORY: She smokes 4 to 5 cigarettes a day. ?Social History  ? ?Tobacco Use  ? Smoking status: Not on file  ? Smokeless tobacco: Not on file  ?Substance Use Topics  ? Alcohol use: Not on file  ? ? ?Not on File ? ?No current facility-administered medications for this encounter.  ? ?No current outpatient medications on file.  ? ? ?REVIEW OF SYSTEMS:  ?[X]  denotes positive finding, [ ]  denotes negative finding ?Cardiac  Comments:  ?Chest pain or chest pressure:    ?Shortness of breath upon exertion: x   ?Short of breath when lying flat:    ?Irregular heart rhythm:    ?    ?Vascular    ?Pain in calf, thigh, or hip brought on by ambulation:    ?Pain in feet at night that wakes you up from your sleep:     ?Blood clot in your veins:    ?Leg swelling:     ?    ?Pulmonary    ?Oxygen at home:    ?Productive cough:     ?Wheezing:     ?    ?Neurologic    ?Sudden weakness in arms or legs:     ?Sudden numbness in arms or legs:     ?Sudden onset of difficulty speaking or slurred speech:    ?  Temporary loss of vision in one eye:     ?Problems with dizziness:     ?    ?Gastrointestinal    ?Blood in stool:     ?Vomited blood:     ?    ?Genitourinary    ?Burning when urinating:     ?Blood in urine:    ?    ?Psychiatric    ?Major depression:     ?    ?Hematologic    ?Bleeding problems:    ?Problems with blood clotting too easily:    ?    ?Skin    ?Rashes or ulcers:    ?    ?Constitutional    ?Fever or chills:    ?- ? ?PHYSICAL EXAM:  ? ?Vitals:  ? 09/20/21 1815 09/20/21 1816 09/20/21 1915 09/20/21 2045  ?BP: (!) 184/80  (!) 235/90 (!) 187/95  ?Pulse: 70  83 84  ?Resp: (!) 29  (!) 27 17  ?SpO2: 91%  97% 97%  ?Weight:  59 kg    ?Height:  5\' 6"  (1.676 m)    ? ?Body mass index is 20.98 kg/m?. ?GENERAL: The patient is a well-nourished female, in no acute distress. The vital signs are documented above. ?CARDIAC: There  is a regular rate and rhythm.  ?VASCULAR: I do not detect carotid bruits although she is breathing and making it difficult to fully assess. ?She has femoral pulses bilaterally.  She has dorsalis pedis and posterior tibial pulses bilaterally. ?PULMONARY: There is good air exchange bilaterally without wheezing or rales. ?ABDOMEN: Soft and non-tender with normal pitched bowel sounds.  I do not palpate an abdominal aortic aneurysm. ?MUSCULOSKELETAL: There are no major deformities. ?NEUROLOGIC: No focal weakness or paresthesias are detected. ?SKIN: There are no ulcers or rashes noted. ?PSYCHIATRIC: The patient has a normal affect. ? ?DATA:   ? ?CT ANGIO CHEST ABDOMEN PELVIS: I have reviewed the images of her CT angiogram of the chest abdomen and pelvis.  She has an enlarged thoracic aorta which is markedly tortuous and becomes aneurysmal at the diaphragmatic hiatus measuring up to 6.9 cm in maximum diameter.  Some of this may be attributed to the tortuosity.  She does not have an abdominal aortic aneurysm.  The thoracic aorta measures about 3 cm proximally.  It dilates up to 3.8 cm in maximum diameter in the mid descending thoracic aorta.  The aneurysmal component measures up to 6.9 mm.  The aneurysm extends to the level of the celiac axis.  The celiac axis and SMA are fairly close to each other. ? ?Katherine Ponce ?[facility] ? ?

## 2021-09-20 NOTE — Progress Notes (Signed)
?   09/20/21 1800  ?Clinical Encounter Type  ?Visited With Patient  ?Visit Type Trauma  ?Spiritual Encounters  ?Spiritual Needs Emotional  ?Stress Factors  ?Patient Stress Factors Loss of control  ? ?Spiritual Care Note ? ?Responded to trauma. EMS reports that Ms Henandez has dementia and is usually conversant at home. Spoke with her briefly. She is particularly concerned with where her dog is, but her husband has not yet arrived to ED to supply additional information. ? ?Available overnight if urgent/emergent needs arise, and will refer to day chaplain for follow-up support for Ms Flavell and family. ? ?Pauline Good, [facility] ?[phone] ?

## 2021-09-21 ENCOUNTER — Encounter (HOSPITAL_COMMUNITY): Payer: Self-pay | Admitting: Internal Medicine

## 2021-09-21 ENCOUNTER — Inpatient Hospital Stay (HOSPITAL_COMMUNITY): Payer: Medicare Other | Admitting: Anesthesiology

## 2021-09-21 ENCOUNTER — Inpatient Hospital Stay (HOSPITAL_COMMUNITY): Payer: Medicare Other

## 2021-09-21 DIAGNOSIS — W19XXXA Unspecified fall, initial encounter: Secondary | ICD-10-CM

## 2021-09-21 DIAGNOSIS — F0390 Unspecified dementia without behavioral disturbance: Secondary | ICD-10-CM | POA: Diagnosis present

## 2021-09-21 DIAGNOSIS — I5022 Chronic systolic (congestive) heart failure: Secondary | ICD-10-CM | POA: Diagnosis not present

## 2021-09-21 DIAGNOSIS — N186 End stage renal disease: Secondary | ICD-10-CM | POA: Diagnosis present

## 2021-09-21 DIAGNOSIS — R9431 Abnormal electrocardiogram [ECG] [EKG]: Secondary | ICD-10-CM | POA: Diagnosis not present

## 2021-09-21 DIAGNOSIS — E785 Hyperlipidemia, unspecified: Secondary | ICD-10-CM | POA: Diagnosis present

## 2021-09-21 DIAGNOSIS — S72001A Fracture of unspecified part of neck of right femur, initial encounter for closed fracture: Secondary | ICD-10-CM | POA: Diagnosis not present

## 2021-09-21 DIAGNOSIS — R778 Other specified abnormalities of plasma proteins: Secondary | ICD-10-CM | POA: Diagnosis present

## 2021-09-21 DIAGNOSIS — S72001D Fracture of unspecified part of neck of right femur, subsequent encounter for closed fracture with routine healing: Secondary | ICD-10-CM

## 2021-09-21 DIAGNOSIS — E039 Hypothyroidism, unspecified: Secondary | ICD-10-CM | POA: Diagnosis present

## 2021-09-21 DIAGNOSIS — Z720 Tobacco use: Secondary | ICD-10-CM | POA: Diagnosis present

## 2021-09-21 DIAGNOSIS — I10 Essential (primary) hypertension: Secondary | ICD-10-CM | POA: Diagnosis present

## 2021-09-21 DIAGNOSIS — Z01818 Encounter for other preprocedural examination: Secondary | ICD-10-CM | POA: Diagnosis not present

## 2021-09-21 DIAGNOSIS — I719 Aortic aneurysm of unspecified site, without rupture: Secondary | ICD-10-CM | POA: Diagnosis present

## 2021-09-21 DIAGNOSIS — S066XAA Traumatic subarachnoid hemorrhage with loss of consciousness status unknown, initial encounter: Secondary | ICD-10-CM | POA: Diagnosis present

## 2021-09-21 DIAGNOSIS — Y92009 Unspecified place in unspecified non-institutional (private) residence as the place of occurrence of the external cause: Secondary | ICD-10-CM

## 2021-09-21 DIAGNOSIS — J449 Chronic obstructive pulmonary disease, unspecified: Secondary | ICD-10-CM | POA: Diagnosis present

## 2021-09-21 LAB — ECHOCARDIOGRAM COMPLETE
AR max vel: 2.78 cm2
AV Area VTI: 2.68 cm2
AV Area mean vel: 2.7 cm2
AV Mean grad: 7 mmHg
AV Peak grad: 13.8 mmHg
Ao pk vel: 1.86 m/s
Area-P 1/2: 3.99 cm2
Calc EF: 36.9 %
Height: 66 in
MV M vel: 5.33 m/s
MV Peak grad: 113.6 mmHg
Radius: 0.3 cm
S' Lateral: 5.3 cm
Single Plane A2C EF: 34.6 %
Single Plane A4C EF: 42.6 %
Weight: 2080 oz

## 2021-09-21 LAB — TROPONIN I (HIGH SENSITIVITY)
Troponin I (High Sensitivity): 39 ng/L — ABNORMAL HIGH (ref <18)
Troponin I (High Sensitivity): 41 ng/L — ABNORMAL HIGH (ref <18)

## 2021-09-21 LAB — HEPATITIS B SURFACE ANTIGEN: Hepatitis B Surface Ag: NONREACTIVE

## 2021-09-21 LAB — CBC
HCT: 33 % — ABNORMAL LOW (ref 36.0–46.0)
Hemoglobin: 10.6 g/dL — ABNORMAL LOW (ref 12.0–15.0)
MCH: 31.6 pg (ref 26.0–34.0)
MCHC: 32.1 g/dL (ref 30.0–36.0)
MCV: 98.5 fL (ref 80.0–100.0)
Platelets: 168 10*3/uL (ref 150–400)
RBC: 3.35 MIL/uL — ABNORMAL LOW (ref 3.87–5.11)
RDW: 14.6 % (ref 11.5–15.5)
WBC: 10.7 10*3/uL — ABNORMAL HIGH (ref 4.0–10.5)
nRBC: 0 % (ref 0.0–0.2)

## 2021-09-21 LAB — RENAL FUNCTION PANEL
Albumin: 3 g/dL — ABNORMAL LOW (ref 3.5–5.0)
Anion gap: 10 (ref 5–15)
BUN: 31 mg/dL — ABNORMAL HIGH (ref 8–23)
CO2: 27 mmol/L (ref 22–32)
Calcium: 9.1 mg/dL (ref 8.9–10.3)
Chloride: 104 mmol/L (ref 98–111)
Creatinine, Ser: 5.86 mg/dL — ABNORMAL HIGH (ref 0.44–1.00)
GFR, Estimated: 7 mL/min — ABNORMAL LOW (ref >60)
Glucose, Bld: 85 mg/dL (ref 70–99)
Phosphorus: 6.5 mg/dL — ABNORMAL HIGH (ref 2.5–4.6)
Potassium: 4.8 mmol/L (ref 3.5–5.1)
Sodium: 141 mmol/L (ref 135–145)

## 2021-09-21 LAB — TSH: TSH: 3.951 u[IU]/mL (ref 0.350–4.500)

## 2021-09-21 MED ORDER — LIDOCAINE-PRILOCAINE 2.5-2.5 % EX CREA: 1.0000 "application " | TOPICAL_CREAM | CUTANEOUS | Status: DC | PRN

## 2021-09-21 MED ORDER — ATORVASTATIN CALCIUM 10 MG PO TABS
20.0000 mg | ORAL_TABLET | Freq: Every day | ORAL | Status: DC
  Administered 2021-09-22 – 2021-09-27 (×7): 20 mg via ORAL
  Filled 2021-09-21 (×7): qty 2

## 2021-09-21 MED ORDER — METHOCARBAMOL 1000 MG/10ML IJ SOLN
500.0000 mg | Freq: Four times a day (QID) | INTRAVENOUS | Status: DC | PRN
  Filled 2021-09-21: qty 5

## 2021-09-21 MED ORDER — POLYETHYLENE GLYCOL 3350 17 G PO PACK: 17.0000 g | PACK | Freq: Every day | ORAL | Status: DC | PRN

## 2021-09-21 MED ORDER — MORPHINE SULFATE (PF) 2 MG/ML IV SOLN
2.0000 mg | Freq: Once | INTRAVENOUS | Status: AC
  Administered 2021-09-21: 2 mg via INTRAVENOUS
  Filled 2021-09-21: qty 1

## 2021-09-21 MED ORDER — HYDRALAZINE HCL 50 MG PO TABS
100.0000 mg | ORAL_TABLET | Freq: Three times a day (TID) | ORAL | Status: DC
  Administered 2021-09-22 – 2021-09-28 (×16): 100 mg via ORAL
  Filled 2021-09-21 (×18): qty 2

## 2021-09-21 MED ORDER — HYDROMORPHONE HCL 1 MG/ML IJ SOLN
0.5000 mg | INTRAMUSCULAR | Status: DC | PRN
  Administered 2021-09-21 – 2021-09-22 (×4): 0.5 mg via INTRAVENOUS
  Filled 2021-09-21: qty 1
  Filled 2021-09-21: qty 0.5
  Filled 2021-09-21: qty 1
  Filled 2021-09-21: qty 0.5

## 2021-09-21 MED ORDER — SERTRALINE HCL 50 MG PO TABS
150.0000 mg | ORAL_TABLET | Freq: Every day | ORAL | Status: DC
  Administered 2021-09-22 – 2021-09-28 (×6): 150 mg via ORAL
  Filled 2021-09-21 (×6): qty 1

## 2021-09-21 MED ORDER — PENTAFLUOROPROP-TETRAFLUOROETH EX AERO: 1.0000 "application " | INHALATION_SPRAY | CUTANEOUS | Status: DC | PRN

## 2021-09-21 MED ORDER — LIDOCAINE HCL (PF) 1 % IJ SOLN
5.0000 mL | INTRAMUSCULAR | Status: DC | PRN
  Filled 2021-09-21: qty 5

## 2021-09-21 MED ORDER — ROPIVACAINE HCL 5 MG/ML IJ SOLN
INTRAMUSCULAR | Status: DC | PRN
  Administered 2021-09-21: 30 mL via PERINEURAL

## 2021-09-21 MED ORDER — CHLORHEXIDINE GLUCONATE CLOTH 2 % EX PADS
6.0000 | MEDICATED_PAD | Freq: Every day | CUTANEOUS | Status: DC
  Administered 2021-09-22 – 2021-09-26 (×4): 6 via TOPICAL

## 2021-09-21 MED ORDER — ALBUTEROL SULFATE (2.5 MG/3ML) 0.083% IN NEBU: 2.5000 mg | INHALATION_SOLUTION | RESPIRATORY_TRACT | Status: DC | PRN

## 2021-09-21 MED ORDER — FENTANYL CITRATE (PF) 100 MCG/2ML IJ SOLN
INTRAMUSCULAR | Status: AC
  Administered 2021-09-21: 50 ug
  Filled 2021-09-21: qty 2

## 2021-09-21 MED ORDER — ALTEPLASE 2 MG IJ SOLR
2.0000 mg | Freq: Once | INTRAMUSCULAR | Status: DC | PRN
Start: 2021-09-21 — End: 2021-09-28
  Filled 2021-09-21: qty 2

## 2021-09-21 MED ORDER — METHOCARBAMOL 1000 MG/10ML IJ SOLN
500.0000 mg | Freq: Once | INTRAVENOUS | Status: AC
  Administered 2021-09-21: 500 mg via INTRAVENOUS
  Filled 2021-09-21: qty 500

## 2021-09-21 MED ORDER — HEPARIN SODIUM (PORCINE) 1000 UNIT/ML DIALYSIS
1000.0000 [IU] | INTRAMUSCULAR | Status: DC | PRN
  Filled 2021-09-21: qty 1

## 2021-09-21 MED ORDER — SENNA 8.6 MG PO TABS
1.0000 | ORAL_TABLET | Freq: Two times a day (BID) | ORAL | Status: DC
  Administered 2021-09-22 – 2021-09-28 (×12): 8.6 mg via ORAL
  Filled 2021-09-21 (×12): qty 1

## 2021-09-21 MED ORDER — SODIUM CHLORIDE 0.9 % IV SOLN: 100.0000 mL | INTRAVENOUS | Status: DC | PRN

## 2021-09-21 MED ORDER — LABETALOL HCL 5 MG/ML IV SOLN
10.0000 mg | INTRAVENOUS | Status: DC | PRN
  Administered 2021-09-21 – 2021-09-23 (×4): 10 mg via INTRAVENOUS
  Filled 2021-09-21 (×6): qty 4

## 2021-09-21 MED ORDER — LEVOTHYROXINE SODIUM 75 MCG PO TABS
75.0000 ug | ORAL_TABLET | Freq: Every day | ORAL | Status: DC
  Administered 2021-09-22 – 2021-09-28 (×5): 75 ug via ORAL
  Filled 2021-09-21 (×5): qty 1

## 2021-09-21 MED ORDER — METHOCARBAMOL 500 MG PO TABS
500.0000 mg | ORAL_TABLET | Freq: Four times a day (QID) | ORAL | Status: DC | PRN
  Administered 2021-09-22 – 2021-09-25 (×2): 500 mg via ORAL
  Filled 2021-09-21 (×3): qty 1

## 2021-09-21 MED ORDER — LABETALOL HCL 5 MG/ML IV SOLN
10.0000 mg | Freq: Once | INTRAVENOUS | Status: AC
  Administered 2021-09-21: 10 mg via INTRAVENOUS
  Filled 2021-09-21: qty 4

## 2021-09-21 MED ORDER — SEVELAMER CARBONATE 800 MG PO TABS
2400.0000 mg | ORAL_TABLET | Freq: Three times a day (TID) | ORAL | Status: DC
  Administered 2021-09-25 – 2021-09-28 (×7): 2400 mg via ORAL
  Filled 2021-09-21 (×8): qty 3

## 2021-09-21 MED ORDER — DOCUSATE SODIUM 100 MG PO CAPS
100.0000 mg | ORAL_CAPSULE | Freq: Two times a day (BID) | ORAL | Status: DC
  Administered 2021-09-22: 100 mg via ORAL
  Filled 2021-09-21: qty 1

## 2021-09-21 MED ORDER — HYDROCODONE-ACETAMINOPHEN 5-325 MG PO TABS: 1.0000 | ORAL_TABLET | Freq: Four times a day (QID) | ORAL | Status: DC | PRN

## 2021-09-21 MED ORDER — LABETALOL HCL 5 MG/ML IV SOLN: 10.0000 mg | INTRAVENOUS | Status: DC | PRN

## 2021-09-21 MED ORDER — LABETALOL HCL 300 MG PO TABS
300.0000 mg | ORAL_TABLET | Freq: Two times a day (BID) | ORAL | Status: DC
Start: 2021-09-21 — End: 2021-09-28
  Administered 2021-09-22 – 2021-09-28 (×13): 300 mg via ORAL
  Filled 2021-09-21 (×10): qty 1
  Filled 2021-09-21: qty 2
  Filled 2021-09-21 (×3): qty 1

## 2021-09-21 NOTE — ED Notes (Signed)
PACU nurse here to take pt for a nerve block. Per PACU nurse, she talked to pt's husband over the phone. ?

## 2021-09-21 NOTE — Assessment & Plan Note (Signed)
Chronic stable albuterol as needed ?

## 2021-09-21 NOTE — Assessment & Plan Note (Addendum)
Resume Synthroid when able to  tolerate recheck TSH ?

## 2021-09-21 NOTE — Assessment & Plan Note (Signed)
PT OT evaluation prior to discharge 

## 2021-09-21 NOTE — Assessment & Plan Note (Signed)
-   management as per orthopedics,  plan to operate  in  a.m.  ?    Keep nothing by mouth post midnight. ?Patien  not on anticoagulation or antiplatelet agents   on hold ?Ordered type and screen, Place Foley, order a vitamin D level ? ?Patient at baseline unable to walk a flight of stairs or 100 feet  ? due to   generalize fatigue and deconditioning  ?  Patient denies any chest pain or shortness of breath currently and/or with exertion, ?   ECG showing no evidence of acute ischemia ?  known history of  COPD ESRD, CHF ? ?Given advanced age patient is at least moderate  risk   which has been discussed with family  ? ?  will order echo  cycle CE  ?  Cardiology consult for further pre-op clearance given extensive cardiac disease ? ? ? ?

## 2021-09-21 NOTE — Assessment & Plan Note (Signed)
Dialysis on Tuesday Thursday Saturday ?Discussed with nephrology aware plan to dialyze ?

## 2021-09-21 NOTE — Assessment & Plan Note (Addendum)
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 ?Patient has been seen by vascular surgery may need at some point referral to [facility] for treatment ?Defer to vascular surgery regarding plan of care ?Try to control BP keep <140 if able ?

## 2021-09-21 NOTE — Assessment & Plan Note (Signed)
Monitor for any sundowning 

## 2021-09-21 NOTE — Assessment & Plan Note (Signed)
Managed through dialysis.  Repeat echogram ?

## 2021-09-21 NOTE — Progress Notes (Signed)
? ? ? [facility] ?                                                                            ? ? ?Katherine Ponce, is a 81 y.o. female, DOB - 21-Mar-1941, EGB:[id number] ?Admit date - 09/20/2021    ?Outpatient Primary MD for the patient is Kristopher Glee., MD ? ?LOS - 1  days ? ? ? ?Brief summary  ? 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 ? ? ?Assessment & Plan  ? ? ?Assessment and Plan: ? ?Mechanical fall ?X-ray showing  Transverse subcapital acute fracture of the right proximal femur with varus angulation. ?Pain control. ?Orthopedics consulted plan for surgical repair as per orthopedics. ?Cardiology consulted for pre op clearance.  ?BEDSIDE echo showed moderate LV dysfunction . She is mildly increased risk given her LV dysfunction. ?  ? ?Essential hypertension ?Blood pressure parameters are sub optimal. Restart home meds.  ?Prn labetalol.  ? ?Mechanical fall with CT of the head showing  Very small amount of subarachnoid blood over the right convexity. ?Neurosurgery consulted and recommended no further work-up.   ? ? ?End-stage renal disease on dialysis ?Nephrology on board plan for HD tomorrow. ? ? ? ?Hyperlipidemia ?Continue with statin. ? ? ? ?Generalized anxiety disorder ?Resume home medications. ? ? ? ?Dementia ?No behavioral abnormalities. ? ? ? ?Anemia of chronic disease ?Hemoglobin around 10.7. ?Monitor .  ? ? ?Leukocytosis ?Unclear etiology. CT chest abdomen and pelvis do not show any infection.  ? ? 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 ?Recommend outpatient follow-up with vascular surgery.  ? ? ?Chronic combined CHF, with moderate pulmonary hypertension ?Pt appears compensated. Pt is on  torsemide at home.  ?Echocardiogram reviewed .  ? ? ?Elevated troponin:  ?From demand ischemia and ESRD.  ? ? ? ?Hypothyroidism: ?TSH wnl. Resume synthroid.  ?  ? ?Estimated body mass index is 20.98 kg/m? as calculated from the following: ?  Height as of this encounter: 5\' 6"  (1.676 m). ?  Weight as of this encounter: 59 kg. ? ?Code Status: full code.  ?DVT Prophylaxis:   ? ? ?Level of Care: Level of care: Progressive ?Family Communication: none at bedside.  ? ?Disposition Plan:     Remains inpatient appropriate: OR today.  ? ?Procedures:  ?None.  ? ?Consultants:   ?Orthopedics ?Cardiology.  ? ?Antimicrobials:  ? ?Anti-infectives (From admission, onward)  ? ? None  ? ?  ? ? ? ?Medications ? ?Scheduled Meds: ?Continuous Infusions: ?PRN Meds:.albuterol, HYDROmorphone (DILAUDID) injection, labetalol ? ? ? ?Subjective:  ? ?Katherine Ponce was seen and examined today.pt denies any new complaints.   ? ?Objective:  ? ?Vitals:  ? 09/21/21 0530 09/21/21 0545 09/21/21 0700 09/21/21 0800  ?BP: (!) 158/71 (!) 147/68 (!) 143/71 (!) 159/68  ?Pulse: 65 63 64 63  ?Resp: 11 11 17  (!) 9  ?Temp:    98.5 ?F (36.9 ?C)  ?TempSrc:    Oral  ?SpO2: 97% 98%  98% 99%  ?Weight:      ?Height:      ? ? ?Intake/Output Summary (Last 24 hours) at 09/21/2021 0936 ?Last data filed at 09/21/2021 0751 ?Gross per 24 hour  ?Intake 47.62 ml  ?Output --  ?Net 47.62 ml  ? ?Filed Weights  ? 09/20/21 1816  ?Weight: 59 kg  ? ? ? ?Exam ?General exam: Appears calm and comfortable  ?Respiratory system: Clear to auscultation. Respiratory effort normal. ?Cardiovascular system: S1 & S2 heard, RRR. No JVD, No pedal edema. ?Gastrointestinal system: Abdomen is nondistended, soft and nontender.. Normal bowel sounds heard. ?Central nervous system: Alert and oriented to person only.  ?Extremities: Symmetric 5 x 5 power. ?Skin: No rashes, lesions or ulcers ?Psychiatry: unable to assess, pt confused.  ? ? ?Data Reviewed:  I have personally reviewed following labs and imaging  studies ? ? ?CBC ?Lab Results  ?Component Value Date  ? WBC 15.2 (H) 09/20/2021  ? RBC 3.40 (L) 09/20/2021  ? HGB 10.7 (L) 09/20/2021  ? HCT 33.2 (L) 09/20/2021  ? MCV 97.6 09/20/2021  ? MCH 31.5 09/20/2021  ? PLT 174 09/20/2021  ? MCHC 32.2 09/20/2021  ? RDW 14.3 09/20/2021  ? LYMPHSABS 0.4 (L) 09/20/2021  ? MONOABS 0.7 09/20/2021  ? EOSABS 0.1 09/20/2021  ? BASOSABS 0.1 09/20/2021  ? ? ? ?Last metabolic panel ?Lab Results  ?Component Value Date  ? NA 140 09/20/2021  ? K 4.8 09/20/2021  ? CL 102 09/20/2021  ? CO2 26 09/20/2021  ? BUN 22 09/20/2021  ? CREATININE 3.80 (H) 09/20/2021  ? GLUCOSE 95 09/20/2021  ? GFRNONAA 11 (L) 09/20/2021  ? CALCIUM 9.0 09/20/2021  ? PHOS 4.1 09/20/2021  ? PROT 6.0 (L) 09/20/2021  ? ALBUMIN 3.3 (L) 09/20/2021  ? BILITOT 1.1 09/20/2021  ? ALKPHOS 93 09/20/2021  ? AST 36 09/20/2021  ? ALT 12 09/20/2021  ? ANIONGAP 11 09/20/2021  ? ? ?CBG (last 3)  ?No results for input(s): GLUCAP in the last 72 hours.  ? ? ?Coagulation Profile: ?Recent Labs  ?Lab 09/20/21 ?1809  ?INR 1.2  ? ? ? ?Radiology Studies: ?CT HEAD WO CONTRAST ? ?Result Date: 09/20/2021 ?CLINICAL DATA:  Head trauma EXAM: CT HEAD WITHOUT CONTRAST TECHNIQUE: Contiguous axial images were obtained from the base of the skull through the vertex without intravenous contrast. RADIATION DOSE REDUCTION: This exam was performed according to the departmental dose-optimization program which includes automated exposure control, adjustment of the mA and/or kV according to patient size and/or use of iterative reconstruction technique. COMPARISON:  11/07/2017 FINDINGS: Brain: There is a very small amount of subarachnoid blood over the right convexity laterally. Large area of encephalomalacia in the left frontal lobe. 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 Ex vacuo dilatation of the left lateral ventricle superimposed on generalized atrophy. Vascular: Multifocal aneurysm treatment at the skull base. Skull: Right frontal scalp hematoma. Old left  frontal craniotomy. No acute fracture. Sinuses/Orbits: Severe chronic right-sided sinusitis. There is periorbital soft tissue swelling bilaterally. Other: None. IMPRESSION: 1. Very small amount of subarachnoid blood over the right convexity. 2. Right frontal scalp hematoma without underlying fracture. Critical Value/emergent results were called by telephone at the time of interpretation on 09/20/2021 at 7:43 pm to provider [facility] , who verbally acknowledged these results. Electronically Signed   By: Ulyses Jarred M.D.   On: 09/20/2021 19:43  ? ?CT Cervical Spine Wo Contrast ? ?Result Date: 09/20/2021 ?CLINICAL DATA:  Facial trauma, blunt.  Fall EXAM: CT CERVICAL SPINE WITHOUT  CONTRAST TECHNIQUE: Multidetector CT imaging of the cervical spine was performed without intravenous contrast. Multiplanar CT image reconstructions were also generated. RADIATION DOSE REDUCTION: This exam was performed according to the departmental dose-optimization program which includes automated exposure control, adjustment of the mA and/or kV according to patient size and/or use of iterative reconstruction technique. COMPARISON:  None. FINDINGS: Alignment: Grade 1 anterolisthesis C2 on C3. Skull base and vertebrae: Degenerative changes of the spine centered at the C4 through C6 levels. No severe osseous neural foraminal or central canal stenosis. No acute fracture. No aggressive appearing focal osseous lesion or focal pathologic process. Soft tissues and spinal canal: No prevertebral fluid or swelling. No visible canal hematoma. Upper chest: Left pleural effusion. Heterogeneous and enlarged thyroid gland. Recommend thyroid US and biopsy (ref: J Am Coll Radiol. 2015 Feb;12(2): 143-50). Other: Atherosclerotic plaque of the visualized aortic arch as well as its branches coursing within the neck. IMPRESSION: 1. No acute displaced fracture or traumatic listhesis of the cervical spine. 2.  Aortic Atherosclerosis (ICD10-I70.0). 3.  Heterogeneous and enlarged thyroid gland. Recommend thyroid US and biopsy (ref: J Am Coll Radiol. 2015 Feb;12(2): 143-50). Electronically Signed   By: Iven Finn M.D.   On: 09/20/2021 19:34  ? ?DG Pelvis Portable ?

## 2021-09-21 NOTE — Assessment & Plan Note (Signed)
Need to confirm what blood pressure medication patient takes in and doses.  For right now cover the labetalol IV as needed ?

## 2021-09-21 NOTE — TOC CAGE-AID Note (Signed)
Transition of Care (TOC) - CAGE-AID Screening ? ? ?Patient Details  ?Name: Katherine Ponce ?MRN: [id number] ?Date of Birth: 1941-03-08 ? ?Transition of Care (TOC) CM/SW Contact:    ?Elsi Stelzer C Tarpley-Carter, LCSWA ?Phone Number: ?09/21/2021, 10:08 AM ? ? ?Clinical Narrative: ?Pt is unable to participate in Cage Aid. ?Pt is experiencing dementia. ? ?Passenger transport manager, MSW, LCSW-A ?Pronouns:  She/Her/Hers ?[facility]Transitions of Care ?Clinical Social Worker ?Direct Number:  [phone] ?[email or web address]  ? ?CAGE-AID Screening: ?Substance Abuse Screening unable to be completed due to: : Patient unable to participate ? ?  ?  ?  ?  ?  ? ?  ? ?  ? ? ? ? ? ? ?

## 2021-09-21 NOTE — Assessment & Plan Note (Signed)
Mild we will continue to trend obtain echogram patient is point not endorsing chest pain ?

## 2021-09-21 NOTE — Assessment & Plan Note (Addendum)
Katherine Ponce has imaging reviewed by neurosurgery at this point feels that there is no need for repeat CT head we will hold aspirin hold any anticoagulation continue to monitor every 4 neurochecks ?

## 2021-09-21 NOTE — Anesthesia Procedure Notes (Addendum)
Anesthesia Regional Block: Femoral nerve block  ? ?Pre-Anesthetic Checklist: , timeout performed,  Correct Patient, Correct Site, Correct Laterality,  Correct Procedure,, site marked,  Risks and benefits discussed,  Surgical consent,  Pre-op evaluation,  At surgeon's request and post-op pain management ? ?Laterality: Right ? ?Prep: chloraprep     ?  ?Needles:  ?Injection technique: Single-shot ? ?Needle Type: Echogenic Stimulator Needle   ? ? ?Needle Length: 9cm  ?Needle Gauge: 21  ? ? ? ?Additional Needles: ? ? ?Procedures:, nerve stimulator,,,, intact distal pulses,    ? ?Nerve Stimulator or Paresthesia:  ?Response: Quadriceps muscle contraction, 0.45 mA ? ?Additional Responses:  ? ?Narrative:  ?Start time: 09/21/2021 10:30 AM ?End time: 09/21/2021 10:40 AM ?Injection made incrementally with aspirations every 5 mL. ? ?Performed by: Personally  ?Anesthesiologist: Albertha Ghee, MD ? ?Additional Notes: ?Functioning IV was confirmed and monitors were applied.  A 11mm 21ga Arrow echogenic stimulator needle was used. Sterile prep and drape,hand hygiene and sterile gloves were used.  Negative aspiration and negative test dose prior to incremental administration of local anesthetic. The patient tolerated the procedure well. ? ? ? ? ? ?

## 2021-09-21 NOTE — Assessment & Plan Note (Signed)
-   Spoke about importance of quitting spent 5 minutes discussing options for treatment, prior attempts at quitting, and dangers of smoking ? -At this point patient is   NOT  interested in quitting ? - hold off on  nicotine patch for now ? - nursing tobacco cessation protocol ? ?

## 2021-09-21 NOTE — Consult Note (Signed)
Reason for Consult: To manage dialysis and dialysis related needs ?Referring Physician: DR Karleen Hampshire ? ? ?Katherine Ponce is an 81 y.o. female.  ? ?HPI: Katherine Ponce is a 80F with a PMH sig for ESRD on HD TTS, COPD, CHF, dementia, h/o cerebral aneurysms s/p rupture/ coiling who is now seen in consultation at the request of DR Karleen Hampshire for management of ESRD and provision of HD.  Katherine Ponce is not able to provide a lot of history- she isn't really clear on what happened- but she states that yesterday she went to dialysis and had a full session.  According to notes she was walking (walks with a walker) and was trying to get around a chair, tripping and falling. Struck her face on the ground,   ? ?Came to ED and noted small SAH, eval'd by NSG --> no intervention needed.  Had  R fem neck fracture- for OR today pending availability.  Had a nerve block for pain control with anesthesia.   ? ?Lying comfortably in bed.  Awake and alert- feeling some pain when coughs but is manageable. ? ? ?Past Medical History:  ?Diagnosis Date  ? Anxiety   ? Carotid artery disease ([place])   ? Cerebral aneurysm   ? Chronic combined systolic and diastolic CHF (congestive heart failure) ([place])   ? COPD (chronic obstructive pulmonary disease) ([place])   ? Dementia ([place])   ? Depression   ? Emphysema of lung ([place])   ? Encephalomalacia   ? ESRD on dialysis [facility])   ? Hyperlipidemia   ? Hypertension   ? Hypothyroidism   ? Intracranial hemorrhage ([place])   ? Moderate pulmonary hypertension ([place])   ? Stroke [facility])   ? ? ?Past Surgical History:  ?Procedure Laterality Date  ? CEREBRAL ANEURYSM REPAIR    ? ? ?Family History  ?Problem Relation Age of Onset  ? CAD Mother   ? ? ?Social History:  reports that she has been smoking cigarettes. She has never used smokeless tobacco. She reports that she does not currently use alcohol. No history on file for drug use. ? ?Allergies: No Known Allergies ? ?Medications: Scheduled: ? ? ?Results for orders placed or performed during the hospital  encounter of 09/20/21 (from the past 48 hour(s))  ?Comprehensive metabolic panel     Status: Abnormal  ? Collection Time: 09/20/21  6:09 PM  ?Result Value Ref Range  ? Sodium 140 135 - 145 mmol/L  ? Potassium 4.9 3.5 - 5.1 mmol/L  ? Chloride 103 98 - 111 mmol/L  ? CO2 26 22 - 32 mmol/L  ? Glucose, Bld 93 70 - 99 mg/dL  ?  Comment: Glucose reference range applies only to samples taken after fasting for at least 8 hours.  ? BUN 17 8 - 23 mg/dL  ? Creatinine, Ser 3.84 (H) 0.44 - 1.00 mg/dL  ? Calcium 9.0 8.9 - 10.3 mg/dL  ? Total Protein 6.0 (L) 6.5 - 8.1 g/dL  ? Albumin 3.3 (L) 3.5 - 5.0 g/dL  ? AST 36 15 - 41 U/L  ? ALT 12 0 - 44 U/L  ? Alkaline Phosphatase 93 38 - 126 U/L  ? Total Bilirubin 1.1 0.3 - 1.2 mg/dL  ? GFR, Estimated 11 (L) >60 mL/min  ?  Comment: (NOTE) ?Calculated using the CKD-EPI Creatinine Equation (2021) ?  ? Anion gap 11 5 - 15  ?  Comment: Performed at [facility], [place] [address], [place], [place] [address]  ?CBC     Status:  Abnormal  ? Collection Time: 09/20/21  6:09 PM  ?Result Value Ref Range  ? WBC 10.2 4.0 - 10.5 K/uL  ? RBC 3.56 (L) 3.87 - 5.11 MIL/uL  ? Hemoglobin 11.1 (L) 12.0 - 15.0 g/dL  ? HCT 34.2 (L) 36.0 - 46.0 %  ? MCV 96.1 80.0 - 100.0 fL  ? MCH 31.2 26.0 - 34.0 pg  ? MCHC 32.5 30.0 - 36.0 g/dL  ? RDW 14.4 11.5 - 15.5 %  ? Platelets 186 150 - 400 K/uL  ? nRBC 0.0 0.0 - 0.2 %  ?  Comment: Performed at [facility], [place] [address] [place], [place] [address]  ?Ethanol     Status: None  ? Collection Time: 09/20/21  6:09 PM  ?Result Value Ref Range  ? Alcohol, Ethyl (B) <10 <10 mg/dL  ?  Comment: (NOTE) ?Lowest detectable limit for serum alcohol is 10 mg/dL. ? ?For medical purposes only. ?Performed at [facility], [place] [address] [place], [place] ?[address] ?  ?Lactic acid, plasma     Status: None  ? Collection Time: 09/20/21  6:09 PM  ?Result Value Ref Range  ? Lactic Acid, Venous 1.1 0.5 - 1.9 mmol/L  ?  Comment: Performed at [facility], [place] [address] [place], [place] [address]  ?Protime-INR     Status: None  ? Collection Time: 09/20/21  6:09 PM  ?Result Value Ref Range  ? Prothrombin Time 15.0 11.4 - 15.2 seconds  ? INR 1.2 0.8 - 1.2  ?  Comment: (NOTE) ?INR goal varies based on device and disease states. ?Performed at [facility], [place] [address], [place], [place] ?[address] ?  ?Sample to Blood Bank     Status: None  ? Collection Time: 09/20/21  6:09 PM  ?Result Value Ref Range  ? Blood Bank Specimen SAMPLE AVAILABLE FOR TESTING   ? Sample Expiration    ?  09/21/2021,2359 ?Performed at [facility], [place] [address] [place], [place] [address] ?  ?I-Stat Chem 8, ED     Status: Abnormal  ? Collection Time: 09/20/21  6:17 PM  ?Result Value Ref Range  ? Sodium 140 135 - 145 mmol/L  ? Potassium 4.8 3.5 - 5.1 mmol/L  ? Chloride 102 98 - 111 mmol/L  ? BUN 22 8 - 23 mg/dL  ? Creatinine, Ser 3.80 (H) 0.44 - 1.00 mg/dL  ? Glucose, Bld 95 70 - 99 mg/dL  ?  Comment: Glucose reference range applies only to samples taken after fasting for at least 8 hours.  ? Calcium, Ion 1.08 (L) 1.15 - 1.40 mmol/L  ? TCO2 30 22 - 32 mmol/L  ? Hemoglobin 11.9 (L) 12.0 - 15.0 g/dL  ? HCT 35.0 (L) 36.0 - 46.0 %  ?Resp Panel by RT-PCR (Flu A&B, Covid) Nasopharyngeal Swab     Status: None  ? Collection Time: 09/20/21  7:23 PM  ? Specimen: Nasopharyngeal Swab; Nasopharyngeal(NP) swabs in vial transport medium  ?Result Value Ref Range  ? SARS Coronavirus 2 by RT PCR NEGATIVE NEGATIVE  ?  Comment: (NOTE) ?SARS-CoV-2 target nucleic acids are NOT DETECTED. ? ?The SARS-CoV-2 RNA is generally detectable in upper respiratory ?specimens during the acute phase of infection. The lowest ?concentration of SARS-CoV-2 viral copies this assay can detect is ?138 copies/mL. A negative result does not preclude SARS-Cov-2 ?infection and should not be used as the sole basis for treatment or ?other patient management decisions. A negative result  may occur with  ?improper specimen collection/handling,  submission of specimen other ?than nasopharyngeal swab, presence of viral mutation(s) within the ?areas targeted by this assay, and inadequate number of viral ?copies(<138 copies/mL). A negative result must be combined with ?clinical observations, patient history, and epidemiological ?information. The expected result is Negative. ? ?Fact Sheet for Patients:  ?EntrepreneurPulse.com.au ? ?Fact Sheet for Healthcare Providers:  ?IncredibleEmployment.be ? ?This test is no t yet approved or cleared by the [place] FDA and  ?has been authorized for detection and/or diagnosis of SARS-CoV-2 by ?FDA under an Emergency Use Authorization (EUA). This EUA will remain  ?in effect (meaning this test can be used) for the duration of the ?COVID-19 declaration under Section 564(b)(1) of the Act, 21 ?U.S.C.section 360bbb-3(b)(1), unless the authorization is terminated  ?or revoked sooner.  ? ? ?  ? Influenza A by PCR NEGATIVE NEGATIVE  ? Influenza B by PCR NEGATIVE NEGATIVE  ?  Comment: (NOTE) ?The Xpert Xpress SARS-CoV-2/FLU/RSV plus assay is intended as an aid ?in the diagnosis of influenza from Nasopharyngeal swab specimens and ?should not be used as a sole basis for treatment. Nasal washings and ?aspirates are unacceptable for Xpert Xpress SARS-CoV-2/FLU/RSV ?testing. ? ?Fact Sheet for Patients: ?EntrepreneurPulse.com.au ? ?Fact Sheet for Healthcare Providers: ?IncredibleEmployment.be ? ?This test is not yet approved or cleared by the [place] FDA and ?has been authorized for detection and/or diagnosis of SARS-CoV-2 by ?FDA under an Emergency Use Authorization (EUA). This EUA will remain ?in effect (meaning this test can be used) for the duration of the ?COVID-19 declaration under Section 564(b)(1) of the Act, 21 U.S.C. ?section 360bbb-3(b)(1), unless the authorization is terminated or ?revoked. ? ?Performed at [facility], [place] [address]  [place], [place] ?[address] ?  ?CK     Status: Abnormal  ? Collection Time: 09/20/21 11:07 PM  ?Result Value Ref Range  ? Total CK 19 (L) 38 - 234 U/L  ?  Comment: Performed at [facility], [place] [address] [place]

## 2021-09-21 NOTE — Anesthesia Pain Management Evaluation Note (Signed)
?  Anesthesia Pain Consult Note ? ?Patient: Katherine Ponce, 81 y.o., female ? ?Consult Requested by: Hosie Poisson, MD ? ?Reason for Consult: Hip fracture analgesia ? ?Level of Consciousness: alert ? ?Pain: mild  ? ?Last Vitals:  ?Vitals:  ? 09/21/21 0800 09/21/21 0900  ?BP: (!) 159/68 (!) 166/78  ?Pulse: 63 68  ?Resp: (!) 9 17  ?Temp: 36.9 ?C   ?SpO2: 99% 97%  ? ? ?Plan: Peripheral nerve block for pain control ? ?Risks of wet tap, epidural hematoma and spinal cord injury explained to:  ? ?Consent:Risks of procedure as well as the alternatives and risks of each were explained to the (patient/caregiver).  Consent for procedure obtained. ? ?Advance Directive: ? ?No Known Allergies ? ?Physical exam: ?PULM clear to auscultation  ?CARDIO Heart sounds are normal.  Regular rate and rhythm without murmur, gallop or rub.  ?OTHER   ? ?I have reviewed the patient's medications listed below. ? ? ?albuterol, HYDROmorphone (DILAUDID) injection, labetalol ? ?Past Medical History:  ?Diagnosis Date  ?? Anxiety   ?? Carotid artery disease ([place])   ?? Cerebral aneurysm   ?? Chronic combined systolic and diastolic CHF (congestive heart failure) ([place])   ?? COPD (chronic obstructive pulmonary disease) ([place])   ?? Dementia ([place])   ?? Depression   ?? Emphysema of lung ([place])   ?? Encephalomalacia   ?? ESRD on dialysis [facility])   ?? Hyperlipidemia   ?? Hypertension   ?? Hypothyroidism   ?? Intracranial hemorrhage ([place])   ?? Moderate pulmonary hypertension ([place])   ?? Stroke [facility])   ? ?Past Surgical History:  ?Procedure Laterality Date  ?? CEREBRAL ANEURYSM REPAIR    ? ? reports that she has been smoking cigarettes. She has never used smokeless tobacco. She reports that she does not currently use alcohol. No history on file for drug use.  ? ? ?[place] S ?09/21/2021 ? ? ? ? ?

## 2021-09-21 NOTE — Assessment & Plan Note (Addendum)
Restart statin when  able to tolerate   ?

## 2021-09-21 NOTE — Consult Note (Addendum)
?Cardiology Consultation:  ? ?Patient ID: Katherine Ponce ?MRN: [id number]; DOB: 01-07-1941 ? ?Admit date: 09/20/2021 ?Date of Consult: 09/21/2021 ? ?PCP:  Katherine Glee., MD ?  ?[facility] Providers ?Cardiologist:  New to Katherine Ponce ?Click here to update MD or APP on Care Team, Refresh:1}   ? ? ?Patient Profile:  ? ?Katherine Ponce is a 81 y.o. female with a hx of ESRD on HD, hypothyroidism, essential HTN, chronic combined CHF, moderate pulm HTN, mild-moderate MR/mild TR by echo 01/2021, dementia, multiple cerebral aneurysms (ruptured cerebral aneurysm in 1988 s/p clipping, clipping of anterior communicating artery in 2018, additional aneurysm coiling 10/2017 after intracranial hemorrhage), encephalomalacia, strokes on prior imaging, carotid artery disease (details unclear), emphysema, depression, anxiety, HLD, ongoing tobacco abuse who is being seen 09/21/2021 for pre-operative evaluation at the request of Dr. Roel Cluck. ? ?History of Present Illness:  ? ?Katherine Ponce has not previously been seen by [facility] but has had prior medically complex admission at outside hospitals in which echoes have been obtained. The last echo was 01/2021, EF 40-45% (previously 50-55% in 2019), mild global HK, mild LVH, severe BAE, mild-moderate MR, mild TR, moderate pulm HTN, severely dilated IVC. This was obtained during an admission for acute on chronic respiratory failure due to CAP. Do not see prior mention of cardiac evaluation otherwise in the notes. History is primarily obtained by speaking to Katherine Ponce by telephone. He does not thinks she's ever seen a cardiologist and does not think she's ever had any other cardiac history.  ? ?She presented to the hospital yesterday with mechanical fall and right hip fracture. Per Katherine Ponce's report she was standing up yesterday from a potty chair, went to pull up her pants, and tripped and fell. She struck her face on the ground. He thinks she might have been out for a second or two but was able to answer  questions as soon as he got to her side. CT head showed small amount of subarachnoid blood and right frontal scalp hematoma, seen by neurosurgery who did not feel any additional intervention was needed. 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 She was evaluated by vascular surgery who felt she would not be a candidate for standard thoracic stent. He felt the only option would be fenestrated graft or a open thoracoabdominal repair, which would both be associated with significant risk - if family wished to pursue, they would need to refer to [facility]. Troponins were drawn and were flat at 39-39-41, otherwise labs show anemia with last Hgb 10.7, normal thyroid, albumin 3.3.  ? ?Cardiology is consulted for pre-op risk stratification. At baseline she only ambulates short distances in her home. Katherine Ponce states she has not complained of any CP or SOB recently. No edema, palpitations, or other syncope reported. She has fallen several times in the last 6 months but Katherine Ponce otherwise has been there to catch her. ? ? ?Past Medical History:  ?Diagnosis Date  ? Anxiety   ? Carotid artery disease ([place])   ? Cerebral aneurysm   ? Chronic combined systolic and diastolic CHF (congestive heart failure) ([place])   ? COPD (chronic obstructive pulmonary disease) ([place])   ? Dementia ([employer])   ? Depression   ? Emphysema of lung ([place])   ? Encephalomalacia   ? ESRD on dialysis [facility])   ? Hyperlipidemia   ? Hypertension   ? Hypothyroidism   ? Intracranial hemorrhage ([place])   ? Moderate pulmonary hypertension ([place])   ? Stroke (  [place])   ? ? ?Past Surgical History:  ?Procedure Laterality Date  ? CEREBRAL ANEURYSM REPAIR    ?  ? ?Home Medications:  ?Prior to Admission medications   ?Medication Sig Start Date End Date Taking? Authorizing Provider  ?aspirin EC 81 MG tablet Take 81 mg by mouth at bedtime. Swallow whole.   Yes [provider]  ?atorvastatin (LIPITOR) 20 MG tablet Take 20 mg by mouth at  bedtime.   Yes [provider]  ?hydrALAZINE (APRESOLINE) 100 MG tablet Take 100 mg by mouth 3 (three) times daily.   Yes [provider]  ?labetalol (NORMODYNE) 300 MG tablet Take 300 mg by mouth 2 (two) times daily.   Yes [provider]  ?levothyroxine (SYNTHROID) 75 MCG tablet Take 75 mcg by mouth daily before breakfast.   Yes [provider]  ?Multiple Vitamins-Minerals (ONE-A-DAY WOMENS 50+) TABS Take 1 tablet by mouth daily.   Yes [provider]  ?NIFEdipine (PROCARDIA XL/NIFEDICAL XL) 60 MG 24 hr tablet Take 60 mg by mouth daily.   Yes [provider]  ?sertraline (ZOLOFT) 100 MG tablet Take 150 mg by mouth daily.   Yes [provider]  ?sevelamer carbonate (RENVELA) 800 MG tablet Take 2,400 mg by mouth in the morning and at bedtime.   Yes [provider]  ?torsemide (DEMADEX) 20 MG tablet Take 20 mg by mouth 2 (two) times daily.   Yes [provider]  ?vitamin B-12 (CYANOCOBALAMIN) 1000 MCG tablet Take 1,000 mcg by mouth daily.   Yes [provider]  ? ? ?Inpatient Medications: ?Scheduled Meds: ? ?Continuous Infusions: ? ?PRN Meds: ?albuterol, HYDROmorphone (DILAUDID) injection, labetalol ? ?Allergies:   No Known Allergies ? ?Social History:   ?Social History  ? ?Socioeconomic History  ? Marital status: Married  ?  Spouse name: Not on file  ? Number of children: Not on file  ? Years of education: Not on file  ? Highest education level: Not on file  ?Occupational History  ? Not on file  ?Tobacco Use  ? Smoking status: Some Days  ?  Types: Cigarettes  ? Smokeless tobacco: Never  ?Substance and Sexual Activity  ? Alcohol use: Not Currently  ? Drug use: Not on file  ? Sexual activity: Not on file  ?Other Topics Concern  ? Not on file  ?Social History Narrative  ? Not on file  ? ?Social Determinants of Health  ? ?Financial Resource Strain: Not on file  ?Food Insecurity: Not on file  ?Transportation Needs: Not on file   ?Physical Activity: Not on file  ?Stress: Not on file  ?Social Connections: Not on file  ?Intimate Partner Violence: Not on file  ?  ?Family History:   ? ?Family History  ?Problem Relation Age of Onset  ? CAD Mother   ?  ? ?ROS:  ?Unable to fully obtain from patient given dementia ? ?Physical Exam/Data:  ? ?Vitals:  ? 09/21/21 0530 09/21/21 0545 09/21/21 0700 09/21/21 0800  ?BP: (!) 158/71 (!) 147/68 (!) 143/71 (!) 159/68  ?Pulse: 65 63 64 63  ?Resp: 11 11 17  (!) 9  ?Temp:    98.5 ?F (36.9 ?C)  ?TempSrc:    Oral  ?SpO2: 97% 98% 98% 99%  ?Weight:      ?Height:      ? ? ?Intake/Output Summary (Last 24 hours) at 09/21/2021 0813 ?Last data filed at 09/21/2021 0751 ?Gross per 24 hour  ?Intake 47.62 ml  ?Output --  ?Net 47.62 ml  ? ? ?  09/20/2021  ?  6:16 PM  ?Last 3 Weights  ?Weight (lbs) 130 lb  ?Weight (kg) 58.968 kg  ?   ?Body mass index is 20.98 kg/m?.  ?General: Well developed, well nourished elderly WF in no acute distress. ?Head: Normocephalic, sclera non-icteric, right eye injected, no xanthomas, nares are without discharge. Diffuse facial bruising and dried matted blood to right temporal region ?Neck: Negative for carotid bruits. JVP not elevated. ?Lungs: Clear bilaterally to auscultation without wheezes, rales, or rhonchi. Breathing is unlabored. ?Heart: RRR S1 S2 with soft systolic murmur. No rubs or gallops.  ?Abdomen: Soft, non-tender, non-distended with normoactive bowel sounds. No rebound/guarding. ?Extremities: No clubbing or cyanosis. No edema. Distal pedal pulses are 2+ and equal bilaterally. ?Neuro: Alert and oriented to self, DOB only, not place or time. Moves all extremities spontaneously. ?Psych: Calm affect. Poor memory. ? ? ?EKG:  The EKG was personally reviewed and demonstrates:  NSR 81bpm, borderline first degree AVB (appears ~289ms at most), LVH with IVCD and secondary repolarization abnormalities with upsloped ST segments V2-V5 with otherwise nonspecific STTW changes, no prior to compare  to ? ?Telemetry:  Telemetry was personally reviewed and demonstrates:  NSR, occasional PVC ? ?Relevant CV Studies: ?2D echo 01/2021 ?SUMMARY  ?There is mild global hypokinesis of the left ventricle.  ?LV ejection fract

## 2021-09-21 NOTE — Progress Notes (Signed)
?  Echocardiogram ?2D Echocardiogram has been performed. ? ?Katherine Ponce ?09/21/2021, 11:19 AM ?

## 2021-09-22 ENCOUNTER — Inpatient Hospital Stay (HOSPITAL_COMMUNITY): Payer: Medicare Other | Admitting: Anesthesiology

## 2021-09-22 ENCOUNTER — Inpatient Hospital Stay (HOSPITAL_COMMUNITY): Payer: Medicare Other

## 2021-09-22 ENCOUNTER — Encounter (HOSPITAL_COMMUNITY)
Admission: EM | Disposition: A | Discharge status: Home Health | Payer: Self-pay | Source: Home / Self Care | Attending: Internal Medicine

## 2021-09-22 DIAGNOSIS — S72001D Fracture of unspecified part of neck of right femur, subsequent encounter for closed fracture with routine healing: Secondary | ICD-10-CM | POA: Diagnosis not present

## 2021-09-22 DIAGNOSIS — I509 Heart failure, unspecified: Secondary | ICD-10-CM

## 2021-09-22 DIAGNOSIS — F0390 Unspecified dementia without behavioral disturbance: Secondary | ICD-10-CM | POA: Diagnosis not present

## 2021-09-22 DIAGNOSIS — N186 End stage renal disease: Secondary | ICD-10-CM | POA: Diagnosis not present

## 2021-09-22 DIAGNOSIS — I110 Hypertensive heart disease with heart failure: Secondary | ICD-10-CM

## 2021-09-22 DIAGNOSIS — I5022 Chronic systolic (congestive) heart failure: Secondary | ICD-10-CM | POA: Diagnosis not present

## 2021-09-22 DIAGNOSIS — E039 Hypothyroidism, unspecified: Secondary | ICD-10-CM

## 2021-09-22 DIAGNOSIS — S72001A Fracture of unspecified part of neck of right femur, initial encounter for closed fracture: Secondary | ICD-10-CM

## 2021-09-22 HISTORY — PX: ANTERIOR APPROACH HEMI HIP ARTHROPLASTY: SHX6690

## 2021-09-22 LAB — POCT I-STAT, CHEM 8
BUN: 15 mg/dL (ref 8–23)
Calcium, Ion: 1.1 mmol/L — ABNORMAL LOW (ref 1.15–1.40)
Chloride: 98 mmol/L (ref 98–111)
Creatinine, Ser: 2.9 mg/dL — ABNORMAL HIGH (ref 0.44–1.00)
Glucose, Bld: 71 mg/dL (ref 70–99)
HCT: 31 % — ABNORMAL LOW (ref 36.0–46.0)
Hemoglobin: 10.5 g/dL — ABNORMAL LOW (ref 12.0–15.0)
Potassium: 3.7 mmol/L (ref 3.5–5.1)
Sodium: 138 mmol/L (ref 135–145)
TCO2: 30 mmol/L (ref 22–32)

## 2021-09-22 LAB — CBC
HCT: 32.9 % — ABNORMAL LOW (ref 36.0–46.0)
HCT: 30.9 % — ABNORMAL LOW (ref 36.0–46.0)
Hemoglobin: 10.5 g/dL — ABNORMAL LOW (ref 12.0–15.0)
Hemoglobin: 9.7 g/dL — ABNORMAL LOW (ref 12.0–15.0)
MCH: 30.9 pg (ref 26.0–34.0)
MCH: 30.6 pg (ref 26.0–34.0)
MCHC: 31.9 g/dL (ref 30.0–36.0)
MCHC: 31.4 g/dL (ref 30.0–36.0)
MCV: 96.8 fL (ref 80.0–100.0)
MCV: 97.5 fL (ref 80.0–100.0)
Platelets: 170 10*3/uL (ref 150–400)
Platelets: 142 10*3/uL — ABNORMAL LOW (ref 150–400)
RBC: 3.4 MIL/uL — ABNORMAL LOW (ref 3.87–5.11)
RBC: 3.17 MIL/uL — ABNORMAL LOW (ref 3.87–5.11)
RDW: 14.7 % (ref 11.5–15.5)
RDW: 14.5 % (ref 11.5–15.5)
WBC: 9.8 10*3/uL (ref 4.0–10.5)
WBC: 12.1 10*3/uL — ABNORMAL HIGH (ref 4.0–10.5)
nRBC: 0 % (ref 0.0–0.2)
nRBC: 0 % (ref 0.0–0.2)

## 2021-09-22 LAB — SURGICAL PCR SCREEN
MRSA, PCR: NEGATIVE
Staphylococcus aureus: NEGATIVE

## 2021-09-22 LAB — TYPE AND SCREEN
ABO/RH(D): A POS
Antibody Screen: NEGATIVE

## 2021-09-22 LAB — BASIC METABOLIC PANEL
Anion gap: 9 (ref 5–15)
BUN: 34 mg/dL — ABNORMAL HIGH (ref 8–23)
CO2: 25 mmol/L (ref 22–32)
Calcium: 9.1 mg/dL (ref 8.9–10.3)
Chloride: 106 mmol/L (ref 98–111)
Creatinine, Ser: 6.16 mg/dL — ABNORMAL HIGH (ref 0.44–1.00)
GFR, Estimated: 6 mL/min — ABNORMAL LOW (ref >60)
Glucose, Bld: 77 mg/dL (ref 70–99)
Potassium: 4.8 mmol/L (ref 3.5–5.1)
Sodium: 140 mmol/L (ref 135–145)

## 2021-09-22 LAB — CREATININE, SERUM
Creatinine, Ser: 3.31 mg/dL — ABNORMAL HIGH (ref 0.44–1.00)
GFR, Estimated: 14 mL/min — ABNORMAL LOW (ref >60)

## 2021-09-22 LAB — ALBUMIN: Albumin: 3.1 g/dL — ABNORMAL LOW (ref 3.5–5.0)

## 2021-09-22 LAB — VITAMIN D 25 HYDROXY (VIT D DEFICIENCY, FRACTURES): Vit D, 25-Hydroxy: 55.05 ng/mL (ref 30–100)

## 2021-09-22 LAB — ABO/RH: ABO/RH(D): A POS

## 2021-09-22 SURGERY — HEMIARTHROPLASTY, HIP, DIRECT ANTERIOR APPROACH, FOR FRACTURE
Anesthesia: General | Laterality: Right

## 2021-09-22 MED ORDER — 0.9 % SODIUM CHLORIDE (POUR BTL) OPTIME
TOPICAL | Status: DC | PRN
  Administered 2021-09-22: 1000 mL

## 2021-09-22 MED ORDER — MENTHOL 3 MG MT LOZG: 1.0000 | LOZENGE | OROMUCOSAL | Status: DC | PRN

## 2021-09-22 MED ORDER — HYDROCODONE-ACETAMINOPHEN 5-325 MG PO TABS: 1.0000 | ORAL_TABLET | ORAL | Status: DC | PRN

## 2021-09-22 MED ORDER — CHLORHEXIDINE GLUCONATE 0.12 % MT SOLN: 15.0000 mL | Freq: Once | OROMUCOSAL | Status: DC

## 2021-09-22 MED ORDER — ACETAMINOPHEN 500 MG PO TABS
500.0000 mg | ORAL_TABLET | Freq: Four times a day (QID) | ORAL | Status: AC
  Administered 2021-09-22: 500 mg via ORAL
  Filled 2021-09-22 (×2): qty 1

## 2021-09-22 MED ORDER — CEFAZOLIN SODIUM-DEXTROSE 2-4 GM/100ML-% IV SOLN: 2.0000 g | Freq: Two times a day (BID) | INTRAVENOUS | Status: DC

## 2021-09-22 MED ORDER — PHENOL 1.4 % MT LIQD: 1.0000 | OROMUCOSAL | Status: DC | PRN

## 2021-09-22 MED ORDER — SODIUM CHLORIDE 0.9 % IV SOLN
INTRAVENOUS | Status: DC
Start: 2021-09-22 — End: 2021-09-22

## 2021-09-22 MED ORDER — ORAL CARE MOUTH RINSE: 15.0000 mL | Freq: Once | OROMUCOSAL | Status: DC

## 2021-09-22 MED ORDER — LIDOCAINE 2% (20 MG/ML) 5 ML SYRINGE
INTRAMUSCULAR | Status: DC | PRN
  Administered 2021-09-22: 60 mg via INTRAVENOUS

## 2021-09-22 MED ORDER — ONDANSETRON HCL 4 MG/2ML IJ SOLN: 4.0000 mg | Freq: Four times a day (QID) | INTRAMUSCULAR | Status: DC | PRN

## 2021-09-22 MED ORDER — FENTANYL CITRATE (PF) 100 MCG/2ML IJ SOLN: 25.0000 ug | INTRAMUSCULAR | Status: DC | PRN

## 2021-09-22 MED ORDER — DEXAMETHASONE SODIUM PHOSPHATE 10 MG/ML IJ SOLN
INTRAMUSCULAR | Status: DC | PRN
  Administered 2021-09-22: 10 mg via INTRAVENOUS

## 2021-09-22 MED ORDER — PROPOFOL 10 MG/ML IV BOLUS
INTRAVENOUS | Status: DC | PRN
  Administered 2021-09-22: 80 mg via INTRAVENOUS

## 2021-09-22 MED ORDER — SUGAMMADEX SODIUM 200 MG/2ML IV SOLN
INTRAVENOUS | Status: DC | PRN
  Administered 2021-09-22: 200 mg via INTRAVENOUS

## 2021-09-22 MED ORDER — TRANEXAMIC ACID-NACL 1000-0.7 MG/100ML-% IV SOLN
1000.0000 mg | INTRAVENOUS | Status: AC
  Administered 2021-09-22: 1000 mg via INTRAVENOUS
  Filled 2021-09-22: qty 100

## 2021-09-22 MED ORDER — ONDANSETRON HCL 4 MG/2ML IJ SOLN
INTRAMUSCULAR | Status: DC | PRN
  Administered 2021-09-22: 4 mg via INTRAVENOUS

## 2021-09-22 MED ORDER — METOCLOPRAMIDE HCL 5 MG PO TABS: 5.0000 mg | ORAL_TABLET | Freq: Three times a day (TID) | ORAL | Status: DC | PRN

## 2021-09-22 MED ORDER — FENTANYL CITRATE (PF) 250 MCG/5ML IJ SOLN
INTRAMUSCULAR | Status: DC | PRN
  Administered 2021-09-22: 50 ug via INTRAVENOUS
  Administered 2021-09-22: 25 ug via INTRAVENOUS

## 2021-09-22 MED ORDER — NEPRO/CARBSTEADY PO LIQD
237.0000 mL | Freq: Two times a day (BID) | ORAL | Status: DC
  Administered 2021-09-24 – 2021-09-27 (×7): 237 mL via ORAL

## 2021-09-22 MED ORDER — ENOXAPARIN SODIUM 30 MG/0.3ML IJ SOSY: 30.0000 mg | PREFILLED_SYRINGE | INTRAMUSCULAR | Status: DC

## 2021-09-22 MED ORDER — DOCUSATE SODIUM 100 MG PO CAPS
100.0000 mg | ORAL_CAPSULE | Freq: Two times a day (BID) | ORAL | Status: DC
  Administered 2021-09-22 – 2021-09-28 (×10): 100 mg via ORAL
  Filled 2021-09-22 (×11): qty 1

## 2021-09-22 MED ORDER — PHENYLEPHRINE 40 MCG/ML (10ML) SYRINGE FOR IV PUSH (FOR BLOOD PRESSURE SUPPORT)
PREFILLED_SYRINGE | INTRAVENOUS | Status: DC | PRN
  Administered 2021-09-22 (×3): 80 ug via INTRAVENOUS

## 2021-09-22 MED ORDER — ONDANSETRON HCL 4 MG PO TABS: 4.0000 mg | ORAL_TABLET | Freq: Four times a day (QID) | ORAL | Status: DC | PRN

## 2021-09-22 MED ORDER — SODIUM CHLORIDE 0.9 % IV SOLN: INTRAVENOUS | Status: DC | PRN

## 2021-09-22 MED ORDER — AMLODIPINE BESYLATE 5 MG PO TABS
5.0000 mg | ORAL_TABLET | Freq: Every day | ORAL | Status: DC
  Administered 2021-09-24 – 2021-09-28 (×5): 5 mg via ORAL
  Filled 2021-09-22 (×5): qty 1

## 2021-09-22 MED ORDER — CHLORHEXIDINE GLUCONATE 4 % EX LIQD
60.0000 mL | Freq: Once | CUTANEOUS | Status: DC
  Filled 2021-09-22: qty 60

## 2021-09-22 MED ORDER — CEFAZOLIN SODIUM-DEXTROSE 2-4 GM/100ML-% IV SOLN
2.0000 g | INTRAVENOUS | Status: AC
  Administered 2021-09-22: 2 g via INTRAVENOUS
  Filled 2021-09-22: qty 100

## 2021-09-22 MED ORDER — RENA-VITE PO TABS
1.0000 | ORAL_TABLET | Freq: Every day | ORAL | Status: DC
  Administered 2021-09-22 – 2021-09-27 (×6): 1 via ORAL
  Filled 2021-09-22 (×7): qty 1

## 2021-09-22 MED ORDER — ROCURONIUM BROMIDE 100 MG/10ML IV SOLN
INTRAVENOUS | Status: DC | PRN
  Administered 2021-09-22: 40 mg via INTRAVENOUS

## 2021-09-22 MED ORDER — FENTANYL CITRATE (PF) 250 MCG/5ML IJ SOLN
INTRAMUSCULAR | Status: AC
  Filled 2021-09-22: qty 5

## 2021-09-22 MED ORDER — METOCLOPRAMIDE HCL 5 MG/ML IJ SOLN: 5.0000 mg | Freq: Three times a day (TID) | INTRAMUSCULAR | Status: DC | PRN

## 2021-09-22 MED ORDER — HYDROCODONE-ACETAMINOPHEN 7.5-325 MG PO TABS: 1.0000 | ORAL_TABLET | ORAL | Status: DC | PRN

## 2021-09-22 MED ORDER — MORPHINE SULFATE (PF) 2 MG/ML IV SOLN: 0.5000 mg | INTRAVENOUS | Status: DC | PRN

## 2021-09-22 MED ORDER — POVIDONE-IODINE 10 % EX SWAB: 2.0000 "application " | Freq: Once | CUTANEOUS | Status: DC

## 2021-09-22 MED ORDER — TRANEXAMIC ACID-NACL 1000-0.7 MG/100ML-% IV SOLN
1000.0000 mg | Freq: Once | INTRAVENOUS | Status: AC
  Administered 2021-09-22: 1000 mg via INTRAVENOUS
  Filled 2021-09-22: qty 100

## 2021-09-22 SURGICAL SUPPLY — 61 items
BAG COUNTER SPONGE SURGICOUNT (BAG) ×2 IMPLANT
BLADE SAG 18X100X1.27 (BLADE) IMPLANT
CLSR STERI-STRIP ANTIMIC 1/2X4 (GAUZE/BANDAGES/DRESSINGS) ×3 IMPLANT
COVER PERINEAL POST (MISCELLANEOUS) ×1 IMPLANT
COVER SURGICAL LIGHT HANDLE (MISCELLANEOUS) ×2 IMPLANT
DRAPE C-ARM 42X72 X-RAY (DRAPES) ×1 IMPLANT
DRAPE STERI IOBAN 125X83 (DRAPES) ×1 IMPLANT
DRAPE U-SHAPE 47X51 STRL (DRAPES) IMPLANT
DRSG MEPILEX BORDER 4X8 (GAUZE/BANDAGES/DRESSINGS) ×2 IMPLANT
DURAPREP 26ML APPLICATOR (WOUND CARE) ×2 IMPLANT
ELECT BLADE 4.0 EZ CLEAN MEGAD (MISCELLANEOUS) ×2
ELECT REM PT RETURN 9FT ADLT (ELECTROSURGICAL) ×2
ELECTRODE BLDE 4.0 EZ CLN MEGD (MISCELLANEOUS) ×1 IMPLANT
ELECTRODE REM PT RTRN 9FT ADLT (ELECTROSURGICAL) ×1 IMPLANT
FACESHIELD WRAPAROUND (MASK) IMPLANT
FACESHIELD WRAPAROUND OR TEAM (MASK) ×1 IMPLANT
GLOVE SRG 8 PF TXTR STRL LF DI (GLOVE) ×1 IMPLANT
GLOVE SURG ENC MOIS LTX SZ7.5 (GLOVE) ×2 IMPLANT
GLOVE SURG SYN 7.5  E (GLOVE) ×1
GLOVE SURG SYN 7.5 E (GLOVE) ×1 IMPLANT
GLOVE SURG SYN 7.5 PF PI (GLOVE) ×1 IMPLANT
GLOVE SURG UNDER POLY LF SZ7.5 (GLOVE) ×2 IMPLANT
GLOVE SURG UNDER POLY LF SZ8 (GLOVE) ×2
GOWN STRL REUS W/ TWL LRG LVL3 (GOWN DISPOSABLE) ×2 IMPLANT
GOWN STRL REUS W/ TWL XL LVL3 (GOWN DISPOSABLE) ×1 IMPLANT
GOWN STRL REUS W/TWL LRG LVL3 (GOWN DISPOSABLE) ×4
GOWN STRL REUS W/TWL XL LVL3 (GOWN DISPOSABLE) ×2
HEAD MODULAR ENDO (Orthopedic Implant) ×2 IMPLANT
HEAD UNPLR 45XMDLR STRL HIP (Orthopedic Implant) IMPLANT
KIT BASIN OR (CUSTOM PROCEDURE TRAY) ×2 IMPLANT
KIT TURNOVER KIT B (KITS) ×2 IMPLANT
MANIFOLD NEPTUNE II (INSTRUMENTS) ×2 IMPLANT
NDL 18GX1X1/2 (RX/OR ONLY) (NEEDLE) IMPLANT
NEEDLE 18GX1X1/2 (RX/OR ONLY) (NEEDLE) IMPLANT
NEEDLE HYPO 22GX1.5 SAFETY (NEEDLE) ×2 IMPLANT
NS IRRIG 1000ML POUR BTL (IV SOLUTION) ×2 IMPLANT
PACK TOTAL JOINT (CUSTOM PROCEDURE TRAY) ×2 IMPLANT
PAD ARMBOARD 7.5X6 YLW CONV (MISCELLANEOUS) ×2 IMPLANT
RETRIEVER SUT HEWSON (MISCELLANEOUS) ×1 IMPLANT
SLEEVE UNITRAX (Orthopedic Implant) ×1 IMPLANT
SPONGE T-LAP 18X18 ~~LOC~~+RFID (SPONGE) IMPLANT
STEM ACCOLADE SZ 6 (Hips) ×1 IMPLANT
SUT FIBERWIRE #2 38 REV NDL BL (SUTURE) ×4
SUT MNCRL AB 4-0 PS2 18 (SUTURE) ×2 IMPLANT
SUT STRATAFIX 1PDS 45CM VIOLET (SUTURE) ×2 IMPLANT
SUT VIC AB 0 CT1 27 (SUTURE)
SUT VIC AB 0 CT1 27XBRD ANBCTR (SUTURE) ×2 IMPLANT
SUT VIC AB 1 CT1 27 (SUTURE) ×2
SUT VIC AB 1 CT1 27XBRD ANBCTR (SUTURE) ×2 IMPLANT
SUT VIC AB 2-0 CT1 27 (SUTURE) ×4
SUT VIC AB 2-0 CT1 TAPERPNT 27 (SUTURE) ×2 IMPLANT
SUTURE FIBERWR#2 38 REV NDL BL (SUTURE) IMPLANT
SYR 20ML LL LF (SYRINGE) IMPLANT
SYR 50ML LL SCALE MARK (SYRINGE) ×4 IMPLANT
SYR BULB IRRIG 60ML STRL (SYRINGE) ×2 IMPLANT
TOWEL GREEN STERILE (TOWEL DISPOSABLE) ×2 IMPLANT
TOWEL GREEN STERILE FF (TOWEL DISPOSABLE) ×2 IMPLANT
TRAY CATH 16FR W/PLASTIC CATH (SET/KITS/TRAYS/PACK) IMPLANT
TRAY FOLEY MTR SLVR 16FR STAT (SET/KITS/TRAYS/PACK) IMPLANT
WATER STERILE IRR 1000ML POUR (IV SOLUTION) ×2 IMPLANT
YANKAUER SUCT BULB TIP NO VENT (SUCTIONS) ×4 IMPLANT

## 2021-09-22 NOTE — Transfer of Care (Signed)
Immediate Anesthesia Transfer of Care Note ? ?Patient: Katherine Ponce ? ?Procedure(s) Performed: RIGHT POSTERIOR APPROACH HEMI HIP ARTHROPLASTY (Right) ? ?Patient Location: PACU ? ?Anesthesia Type:General ? ?Level of Consciousness: drowsy and patient cooperative ? ?Airway & Oxygen Therapy: Patient Spontanous Breathing and Patient connected to face mask oxygen ? ?Post-op Assessment: Report given to RN and Post -op Vital signs reviewed and stable ? ?Post vital signs: Reviewed and stable ? ?Last Vitals:  ?Vitals Value Taken Time  ?BP 157/75 09/22/21 1659  ?Temp    ?Pulse 73 09/22/21 1702  ?Resp 16 09/22/21 1702  ?SpO2 99 % 09/22/21 1702  ?Vitals shown include unvalidated device data. ? ?Last Pain:  ?Vitals:  ? 09/22/21 1433  ?TempSrc: Oral  ?PainSc: 0-No pain  ?   ? ?Patients Stated Pain Goal: 3 (09/22/21 1433) ? ?Complications: No notable events documented. ?

## 2021-09-22 NOTE — Anesthesia Procedure Notes (Signed)
Procedure Name: Intubation ?Date/Time: 09/22/2021 3:35 PM ?Performed by: Thelma Comp, CRNA ?Pre-anesthesia Checklist: Patient identified, Emergency Drugs available, Suction available and Patient being monitored ?Patient Re-evaluated:Patient Re-evaluated prior to induction ?Oxygen Delivery Method: Circle System Utilized ?Preoxygenation: Pre-oxygenation with 100% oxygen ?Induction Type: IV induction ?Ventilation: Mask ventilation without difficulty ?Grade View: Grade I ?Tube type: Oral ?Tube size: 7.0 mm ?Number of attempts: 1 ?Airway Equipment and Method: Stylet ?Placement Confirmation: ETT inserted through vocal cords under direct vision, positive ETCO2 and breath sounds checked- equal and bilateral ?Secured at: 21 cm ?Tube secured with: Tape ?Dental Injury: Teeth and Oropharynx as per pre-operative assessment  ? ? ? ? ?

## 2021-09-22 NOTE — Care Management Important Message (Signed)
Important Message ? ?Patient Details  ?Name: Katherine Ponce ?MRN: [id number] ?Date of Birth: [birth year] ? ? ?Medicare Important Message Given:  Yes ? ? ? ? ?Katherine Ponce  Katherine Ponce ?09/22/2021, 1:19 PM ?

## 2021-09-22 NOTE — Progress Notes (Addendum)
PHARMACY NOTE:  ANTIMICROBIAL RENAL DOSAGE ADJUSTMENT ? ?Current antimicrobial regimen includes a mismatch between antimicrobial dosage and estimated renal function.  As per policy approved by the Pharmacy & Therapeutics and Medical Executive Committees, the antimicrobial dosage will be adjusted accordingly. ? ?Current antimicrobial dosage:  cefazolin 2 g q 6 hr ? ?Indication: surgical prophylaxis ? ?Renal Function: ? ?Estimated Creatinine Clearance: 14.7 mL/min (A) (by C-G formula based on SCr of 2.9 mg/dL (H)). Patient is on dialysis.  ?   ?Antimicrobial dosage has been changed to:  2g q24 hr  (no post op dose will be given due to renal function, preop dose given at 1600)  ?  ? ? ?Thank you for allowing pharmacy to be a part of this patient's care. ? ?Benetta Spar, PharmD, BCPS, BCCP ?Clinical Pharmacist ? ?Please check AMION for all [place] phone numbers ?After 10:00 PM, call [place] [phone] ? ?

## 2021-09-22 NOTE — Consult Note (Signed)
Reason for Consult:Right hip fx ?Referring Physician: Toy Baker ?Time called: 0804 ?Time at bedside: 0955 ? ? ?Katherine Ponce is an 81 y.o. female.  ?HPI: Katherine Ponce was at home and getting up from the toilet when she fell forwards. At first she only c/o head pain and was bleeding but soon c/o right hip pain and could not get up. She was brought to the ED where x-rays showed a right hip fx and orthopedic surgery was consulted. She lives at home with her husband and ambulates very little using a RW. ? ?Past Medical History:  ?Diagnosis Date  ? Anxiety   ? Carotid artery disease ([place])   ? Cerebral aneurysm   ? Chronic combined systolic and diastolic CHF (congestive heart failure) ([place])   ? COPD (chronic obstructive pulmonary disease) ([place])   ? Dementia ([place])   ? Depression   ? Emphysema of lung ([place])   ? Encephalomalacia   ? ESRD on dialysis [facility])   ? Hyperlipidemia   ? Hypertension   ? Hypothyroidism   ? Intracranial hemorrhage ([place])   ? Moderate pulmonary hypertension ([place])   ? Stroke [facility])   ? ? ?Past Surgical History:  ?Procedure Laterality Date  ? CEREBRAL ANEURYSM REPAIR    ? ? ?Family History  ?Problem Relation Age of Onset  ? CAD Mother   ? ? ?Social History:  reports that she has been smoking cigarettes. She has never used smokeless tobacco. She reports that she does not currently use alcohol. No history on file for drug use. ? ?Allergies: No Known Allergies ? ?Medications: I have reviewed the patient's current medications. ? ?Results for orders placed or performed during the hospital encounter of 09/20/21 (from the past 48 hour(s))  ?Comprehensive metabolic panel     Status: Abnormal  ? Collection Time: 09/20/21  6:09 PM  ?Result Value Ref Range  ? Sodium 140 135 - 145 mmol/L  ? Potassium 4.9 3.5 - 5.1 mmol/L  ? Chloride 103 98 - 111 mmol/L  ? CO2 26 22 - 32 mmol/L  ? Glucose, Bld 93 70 - 99 mg/dL  ?  Comment: Glucose reference range applies only to samples taken after fasting for at least 8 hours.  ? BUN  17 8 - 23 mg/dL  ? Creatinine, Ser 3.84 (H) 0.44 - 1.00 mg/dL  ? Calcium 9.0 8.9 - 10.3 mg/dL  ? Total Protein 6.0 (L) 6.5 - 8.1 g/dL  ? Albumin 3.3 (L) 3.5 - 5.0 g/dL  ? AST 36 15 - 41 U/L  ? ALT 12 0 - 44 U/L  ? Alkaline Phosphatase 93 38 - 126 U/L  ? Total Bilirubin 1.1 0.3 - 1.2 mg/dL  ? GFR, Estimated 11 (L) >60 mL/min  ?  Comment: (NOTE) ?Calculated using the CKD-EPI Creatinine Equation (2021) ?  ? Anion gap 11 5 - 15  ?  Comment: Performed at [facility], [place] [address] [place], [place] [address]  ?CBC     Status: Abnormal  ? Collection Time: 09/20/21  6:09 PM  ?Result Value Ref Range  ? WBC 10.2 4.0 - 10.5 K/uL  ? RBC 3.56 (L) 3.87 - 5.11 MIL/uL  ? Hemoglobin 11.1 (L) 12.0 - 15.0 g/dL  ? HCT 34.2 (L) 36.0 - 46.0 %  ? MCV 96.1 80.0 - 100.0 fL  ? MCH 31.2 26.0 - 34.0 pg  ? MCHC 32.5 30.0 - 36.0 g/dL  ? RDW 14.4 11.5 - 15.5 %  ? Platelets 186 150 - 400  K/uL  ? nRBC 0.0 0.0 - 0.2 %  ?  Comment: Performed at [facility], [place] [address], [place], [place] [address]  ?Ethanol     Status: None  ? Collection Time: 09/20/21  6:09 PM  ?Result Value Ref Range  ? Alcohol, Ethyl (B) <10 <10 mg/dL  ?  Comment: (NOTE) ?Lowest detectable limit for serum alcohol is 10 mg/dL. ? ?For medical purposes only. ?Performed at [facility], [place] [address] [place], [place] ?[address] ?  ?Lactic acid, plasma     Status: None  ? Collection Time: 09/20/21  6:09 PM  ?Result Value Ref Range  ? Lactic Acid, Venous 1.1 0.5 - 1.9 mmol/L  ?  Comment: Performed at [facility], [place] [address] [place], [place] [address]  ?Protime-INR     Status: None  ? Collection Time: 09/20/21  6:09 PM  ?Result Value Ref Range  ? Prothrombin Time 15.0 11.4 - 15.2 seconds  ? INR 1.2 0.8 - 1.2  ?  Comment: (NOTE) ?INR goal varies based on device and disease states. ?Performed at [facility], [place] [address] [place], [place] ?[address] ?  ?Sample to Blood Bank     Status: None  ? Collection Time: 09/20/21  6:09 PM  ?Result Value  Ref Range  ? Blood Bank Specimen SAMPLE AVAILABLE FOR TESTING   ? Sample Expiration    ?  09/21/2021,2359 ?Performed at [facility], [place] [address] [place], [place] [address] ?  ?I-Stat Chem 8, ED     Status: Abnormal  ? Collection Time: 09/20/21  6:17 PM  ?Result Value Ref Range  ? Sodium 140 135 - 145 mmol/L  ? Potassium 4.8 3.5 - 5.1 mmol/L  ? Chloride 102 98 - 111 mmol/L  ? BUN 22 8 - 23 mg/dL  ? Creatinine, Ser 3.80 (H) 0.44 - 1.00 mg/dL  ? Glucose, Bld 95 70 - 99 mg/dL  ?  Comment: Glucose reference range applies only to samples taken after fasting for at least 8 hours.  ? Calcium, Ion 1.08 (L) 1.15 - 1.40 mmol/L  ? TCO2 30 22 - 32 mmol/L  ? Hemoglobin 11.9 (L) 12.0 - 15.0 g/dL  ? HCT 35.0 (L) 36.0 - 46.0 %  ?Resp Panel by RT-PCR (Flu A&B, Covid) Nasopharyngeal Swab     Status: None  ? Collection Time: 09/20/21  7:23 PM  ? Specimen: Nasopharyngeal Swab; Nasopharyngeal(NP) swabs in vial transport medium  ?Result Value Ref Range  ? SARS Coronavirus 2 by RT PCR NEGATIVE NEGATIVE  ?  Comment: (NOTE) ?SARS-CoV-2 target nucleic acids are NOT DETECTED. ? ?The SARS-CoV-2 RNA is generally detectable in upper respiratory ?specimens during the acute phase of infection. The lowest ?concentration of SARS-CoV-2 viral copies this assay can detect is ?138 copies/mL. A negative result does not preclude SARS-Cov-2 ?infection and should not be used as the sole basis for treatment or ?other patient management decisions. A negative result may occur with  ?improper specimen collection/handling, submission of specimen other ?than nasopharyngeal swab, presence of viral mutation(s) within the ?areas targeted by this assay, and inadequate number of viral ?copies(<138 copies/mL). A negative result must be combined with ?clinical observations, patient history, and epidemiological ?information. The expected result is Negative. ? ?Fact Sheet for Patients:  ?EntrepreneurPulse.com.au ? ?Fact Sheet for Healthcare  Providers:  ?IncredibleEmployment.be ? ?This test is no t yet approved or cleared by the [place] and  ?has been authorized for  detection and/or diagnosis of SARS-CoV-2 by ?FDA under an Emergency Use Authorization (EUA). This EUA will remain  ?in effect (meaning this test can be used) for the duration of the ?COVID-19 declaration under Section 564(b)(1) of the Act, 21 ?U.S.C.section 360bbb-3(b)(1), unless the authorization is terminated  ?or revoked sooner.  ? ? ?  ? Influenza A by PCR NEGATIVE NEGATIVE  ? Influenza B by PCR NEGATIVE NEGATIVE  ?  Comment: (NOTE) ?The Xpert Xpress SARS-CoV-2/FLU/RSV plus assay is intended as an aid ?in the diagnosis of influenza from Nasopharyngeal swab specimens and ?should not be used as a sole basis for treatment. Nasal washings and ?aspirates are unacceptable for Xpert Xpress SARS-CoV-2/FLU/RSV ?testing. ? ?Fact Sheet for Patients: ?EntrepreneurPulse.com.au ? ?Fact Sheet for Healthcare Providers: ?IncredibleEmployment.be ? ?This test is not yet approved or cleared by the [place] FDA and ?has been authorized for detection and/or diagnosis of SARS-CoV-2 by ?FDA under an Emergency Use Authorization (EUA). This EUA will remain ?in effect (meaning this test can be used) for the duration of the ?COVID-19 declaration under Section 564(b)(1) of the Act, 21 U.S.C. ?section 360bbb-3(b)(1), unless the authorization is terminated or ?revoked. ? ?Performed at [facility], [place] [address] [place], [place] ?[address] ?  ?CK     Status: Abnormal  ? Collection Time: 09/20/21 11:07 PM  ?Result Value Ref Range  ? Total CK 19 (L) 38 - 234 U/L  ?  Comment: Performed at [facility], [place] [address], [place], [place] [address]  ?Differential     Status: Abnormal  ? Collection Time: 09/20/21 11:07 PM  ?Result Value Ref Range  ? Neutrophils Relative % 90 %  ? Neutro Abs 13.9 (H) 1.7 - 7.7 K/uL  ? Lymphocytes Relative 3 %  ?  Lymphs Abs 0.4 (L) 0.7 - 4.0 K/uL  ? Monocytes Relative 5 %  ? Monocytes Absolute 0.7 0.1 - 1.0 K/uL  ? Eosinophils Relative 1 %  ? Eosinophils Absolute 0.1 0.0 - 0.5 K/uL  ? Basophils Relative 0 %  ? Clarise Cruz

## 2021-09-22 NOTE — Procedures (Signed)
Pt tolerated Hemodialysis well today, only complaints were for pain.  BP upon discharge from Hemodialysis was 168/84, Vital signs were stable throughout treatment.  Pt remains only oriented to herself. Fistula cannulated without difficulty.  Bruit and thrill present.  Pressure held for 10 min for hemostasis following decannulation. ?

## 2021-09-22 NOTE — Progress Notes (Signed)
VASCULAR SURGERY: ? ?She is going to surgery today to have her right femur fracture addressed.  I will arrange follow-up in my office in a month to further discuss her thoracic aneurysm.  Given the location of the aneurysm I think the only option for repair would be a thoracoabdominal open repair which I do not think she would survive.  I have arranged follow-up.  Vascular surgery will be available as needed. ? ?Katherine Gallop, MD ?8:36 AM ? ?

## 2021-09-22 NOTE — Anesthesia Postprocedure Evaluation (Signed)
Anesthesia Post Note ? ?Patient: Daneen Volcy ? ?Procedure(s) Performed: RIGHT POSTERIOR APPROACH HEMI HIP ARTHROPLASTY (Right) ? ?  ? ?Patient location during evaluation: PACU ?Anesthesia Type: General ?Level of consciousness: awake ?Pain management: pain level controlled ?Vital Signs Assessment: post-procedure vital signs reviewed and stable ?Respiratory status: spontaneous breathing ?Cardiovascular status: stable ?Postop Assessment: no apparent nausea or vomiting ?Anesthetic complications: no ? ? ?No notable events documented. ? ?Last Vitals:  ?Vitals:  ? 09/22/21 1700 09/22/21 1715  ?BP: (!) 157/75 (!) 161/73  ?Pulse: 71 70  ?Resp: 15 13  ?Temp: (!) 36.2 ?C   ?SpO2: 100% 100%  ?  ?Last Pain:  ?Vitals:  ? 09/22/21 1700  ?TempSrc:   ?PainSc: Asleep  ? ? ?  ?  ?  ?  ?  ?  ? ?Jarelly Rinck ? ? ? ? ?

## 2021-09-22 NOTE — Progress Notes (Signed)
? ? ? [facility] ?                                                                            ? ? ?Katherine Ponce, is a 81 y.o. female, DOB - 1940-08-09, NTI:[id number] ?Admit date - 09/20/2021    ?Outpatient Primary MD for the patient is Kristopher Glee., MD ? ?LOS - 2  days ? ? ? ?Brief summary  ? 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 ? ? ?Assessment & Plan  ? ? ?Assessment and Plan: ? ?Mechanical fall ?X-ray showing  Transverse subcapital acute fracture of the right proximal femur with varus angulation. ?Pain control. ?Orthopedics consulted plan for surgical repair as per orthopedics.  She is scheduled for right posterior hemi hip arthroplasty today.  ?Cardiology consulted for pre op clearance.  ?Bedside echo showed moderate LV dysfunction . She is mildly increased risk given her LV dysfunction. ?  ? ?Essential hypertension ?Blood pressure parameters are sub optimal. Restart home meds.  ?Prn labetalol.  ? ?Mechanical fall with CT of the head showing  Very small amount of subarachnoid blood over the right convexity. ?Neurosurgery consulted and recommended no further work-up.   ? ? ?End-stage renal disease on dialysis ?Nephrology on board  for HD, tths. ? ? ? ?Hyperlipidemia ?Continue with statin. ? ? ? ?Generalized anxiety disorder ?Resume home medications. ? ? ? ?Dementia ?No behavioral abnormalities. ? ? ? ?Anemia of chronic disease ?Hemoglobin around 10.7. ?Stable.  ? ? ?Leukocytosis ?Resolved.  ?Unclear etiology. CT chest abdomen and pelvis do not show any infection.  ? ? 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 ?Recommend outpatient follow-up with vascular surgery.  ? ? ?Chronic combined CHF,  with moderate pulmonary hypertension ?Pt appears compensated. Pt is on torsemide at home.  ?Echocardiogram reviewed .  ? ? ?Elevated troponin:  ?From demand ischemia and ESRD.  ? ? ? ?Hypothyroidism: ?TSH wnl. Resume synthroid.  ?Interventions: Refer to RD note for recommendations ? ?Estimated body mass index is 21.28 kg/m? as calculated from the following: ?  Height as of this encounter: 5\' 6"  (1.676 m). ?  Weight as of this encounter: 59.8 kg. ? ?Code Status: full code.  ?DVT Prophylaxis:  SCDs Start: 09/21/21 2321 ? ? ?Level of Care: Level of care: Progressive ?Family Communication: none at bedside.  ? ?Disposition Plan:     Remains inpatient appropriate: OR today.  ? ?Procedures:  ?None.  ? ?Consultants:   ?Orthopedics ?Cardiology.  ? ?Antimicrobials:  ? ?Anti-infectives (From admission, onward)  ? ? Start     Dose/Rate Route Frequency Ordered Stop  ? 09/23/21 0600  ceFAZolin (ANCEF) IVPB 2g/100 mL premix       ? 2 g ?200 mL/hr over 30 Minutes Intravenous On call to O.R. 09/22/21 1319 09/22/21 1540  ? ?  ? ? ? ?Medications ? ?Scheduled Meds: ? [MAR Hold] amLODipine  5 mg Oral Daily  ? [MAR Hold] atorvastatin  20 mg Oral QHS  ?  chlorhexidine  60 mL Topical Once  ? chlorhexidine  15 mL Mouth/Throat Once  ? Or  ? mouth rinse  15 mL Mouth Rinse Once  ? [MAR Hold] Chlorhexidine Gluconate Cloth  6 each Topical Q0600  ? [MAR Hold] docusate sodium  100 mg Oral BID  ? [MAR Hold] feeding supplement (NEPRO CARB STEADY)  237 mL Oral BID BM  ? [MAR Hold] hydrALAZINE  100 mg Oral Q8H  ? [MAR Hold] labetalol  300 mg Oral BID  ? [MAR Hold] levothyroxine  75 mcg Oral QAC breakfast  ? [MAR Hold] multivitamin  1 tablet Oral QHS  ? povidone-iodine  2 application. Topical Once  ? [MAR Hold] senna  1 tablet Oral BID  ? [MAR Hold] sertraline  150 mg Oral Daily  ? [MAR Hold] sevelamer carbonate  2,400 mg Oral TID WC  ? ?Continuous Infusions: ? [MAR Hold] sodium chloride    ? [MAR Hold] sodium chloride    ? sodium chloride    ? [MAR  Hold] methocarbamol (ROBAXIN) IV    ? ?PRN Meds:.[MAR Hold] sodium chloride, [MAR Hold] sodium chloride, [MAR Hold] albuterol, [MAR Hold] alteplase, fentaNYL (SUBLIMAZE) injection, [MAR Hold] heparin, [MAR Hold] HYDROcodone-acetaminophen, [MAR Hold]  HYDROmorphone (DILAUDID) injection, [MAR Hold] labetalol, [MAR Hold] lidocaine (PF), [MAR Hold] lidocaine-prilocaine, [MAR Hold] methocarbamol **OR** [MAR Hold] methocarbamol (ROBAXIN) IV, [MAR Hold] pentafluoroprop-tetrafluoroeth, [MAR Hold] polyethylene glycol ? ? ? ?Subjective:  ? ?Katherine Ponce was seen and examined today. Pt denies any new complaints.  ? ?Objective:  ? ?Vitals:  ? 09/22/21 1232 09/22/21 1341 09/22/21 1433 09/22/21 1700  ?BP: (!) 175/79 (!) 168/76 (!) 168/79 (!) 157/75  ?Pulse: 80 79 80 71  ?Resp:  15 16 15   ?Temp:  98.4 ?F (36.9 ?C) 98.3 ?F (36.8 ?C) (!) 97.1 ?F (36.2 ?C)  ?TempSrc:  Oral Oral   ?SpO2:  99% 96% 100%  ?Weight:      ?Height:      ? ? ?Intake/Output Summary (Last 24 hours) at 09/22/2021 1716 ?Last data filed at 09/22/2021 1653 ?Gross per 24 hour  ?Intake 500 ml  ?Output 71 ml  ?Net 429 ml  ? ? ?Filed Weights  ? 09/20/21 1816 09/22/21 0727 09/22/21 1130  ?Weight: 59 kg 59.5 kg 59.8 kg  ? ? ? ?Exam ?General exam: Appears calm and comfortable  ?Respiratory system: Clear to auscultation. Respiratory effort normal. ?Cardiovascular system: S1 & S2 heard, RRR. No JVD, No pedal edema. ?Gastrointestinal system: Abdomen is nondistended, soft and nontender. Normal bowel sounds heard. ?Central nervous system: Alert and oriented. No focal neurological deficits. ?Extremities: painful ROM  ?Skin: No rashes, lesions or ulcers ?Psychiatry: Mood & affect appropriate.  ? ?Data Reviewed:  I have personally reviewed following labs and imaging studies ? ? ?CBC ?Lab Results  ?Component Value Date  ? WBC 9.8 09/22/2021  ? RBC 3.40 (L) 09/22/2021  ? HGB 10.5 (L) 09/22/2021  ? HCT 31.0 (L) 09/22/2021  ? MCV 96.8 09/22/2021  ? MCH 30.9 09/22/2021  ? PLT 170  09/22/2021  ? MCHC 31.9 09/22/2021  ? RDW 14.7 09/22/2021  ? LYMPHSABS 0.4 (L) 09/20/2021  ? MONOABS 0.7 09/20/2021  ? EOSABS 0.1 09/20/2021  ? BASOSABS 0.1 09/20/2021  ? ? ? ?Last metabolic panel ?Lab Results  ?Component Value Date  ? NA 138 09/22/2021  ? K 3.7 09/22/2021  ? CL 98 09/22/2021  ? CO2 25 09/22/2021  ? BUN 15 09/22/2021  ? CREATININE 2.90 (H) 09/22/2021  ? GLUCOSE 71 09/22/2021  ?  GFRNONAA 6 (L) 09/22/2021  ? CALCIUM 9.1 09/22/2021  ? PHOS 6.5 (H) 09/21/2021  ? PROT 6.0 (L) 09/20/2021  ? ALBUMIN 3.1 (L) 09/22/2021  ? BILITOT 1.1 09/20/2021  ? ALKPHOS 93 09/20/2021  ? AST 36 09/20/2021  ? ALT 12 09/20/2021  ? ANIONGAP 9 09/22/2021  ? ? ?CBG (last 3)  ?No results for input(s): GLUCAP in the last 72 hours.  ? ? ?Coagulation Profile: ?Recent Labs  ?Lab 09/20/21 ?1809  ?INR 1.2  ? ? ? ? ?Radiology Studies: ?CT HEAD WO CONTRAST ? ?Result Date: 09/20/2021 ?CLINICAL DATA:  Head trauma EXAM: CT HEAD WITHOUT CONTRAST TECHNIQUE: Contiguous axial images were obtained from the base of the skull through the vertex without intravenous contrast. RADIATION DOSE REDUCTION: This exam was performed according to the departmental dose-optimization program which includes automated exposure control, adjustment of the mA and/or kV according to patient size and/or use of iterative reconstruction technique. COMPARISON:  11/07/2017 FINDINGS: Brain: There is a very small amount of subarachnoid blood over the right convexity laterally. Large area of encephalomalacia in the left frontal lobe. 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 Ex vacuo dilatation of the left lateral ventricle superimposed on generalized atrophy. Vascular: Multifocal aneurysm treatment at the skull base. Skull: Right frontal scalp hematoma. Old left frontal craniotomy. No acute fracture. Sinuses/Orbits: Severe chronic right-sided sinusitis. There is periorbital soft tissue swelling bilaterally. Other: None. IMPRESSION: 1. Very small amount of subarachnoid blood  over the right convexity. 2. Right frontal scalp hematoma without underlying fracture. Critical Value/emergent results were called by telephone at the time of interpretation on 09/20/2021 at 7:43 pm to provider MAD

## 2021-09-22 NOTE — Progress Notes (Signed)
?  [facility] ?Progress Note  ? ?Assessment/ Plan:   ?Assessment/Plan: ?1 R hip fracture: 2/2 mechanical fall.  Plan for repair with ortho today ?2.  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  NO surgical intervention recommended  ?3 ESRD: TTS with Dr Johny Blamer HD today 09/22/21 on schedule, keep even, reduce BFR and no heparin ?4 Hypertension: labetalol and hydralazine reordered, UF as tolerated tomorrow- doesn't appear grossly volume overloaded ?5. Anemia of ESRD: Hgb 10.2, no ESA needed ?6. Metabolic Bone Disease: Renvela as binder, Phos 4.1 ?7.  Dispo: admitted ? ?Subjective:   ? ?Seen and examined on HD.  BFR 300 mL/ min via RUE AVF, UF goal even.  Going to OR today for repair of R hip fracture.  She is in some pain.  Denies f/c, n/v, SOB, CP.    ? ?Objective:   ?BP (!) 151/71   Pulse 72   Temp 97.7 ?F (36.5 ?C) (Tympanic)   Resp 11   Ht 5\' 6"  (1.676 m)   Wt 59.5 kg   SpO2 92%   BMI 21.17 kg/m?  ? ?Physical Exam: ?GEN nad, sitting in bed, on HD ?HEENT bilateral orbit ecchymoses, R forehead lac ?NECK no JVD ?PULM clear ?CVRRR ?ABD; soft ?EXT no LE edema, R leg bent at knee, rotated  ?NEURO some memory issues  ?ACCESS: R AVF + T/B ? ?Labs: ?BMET ?Recent Labs  ?Lab 09/20/21 ?1809 09/20/21 ?1817 09/20/21 ?2307 09/21/21 ?2131 09/22/21 ?0145  ?NA 140 140  --  141 140  ?K 4.9 4.8  --  4.8 4.8  ?CL 103 102  --  104 106  ?CO2 26  --   --  27 25  ?GLUCOSE 93 95  --  85 77  ?BUN 17 22  --  31* 34*  ?CREATININE 3.84* 3.80*  --  5.86* 6.16*  ?CALCIUM 9.0  --   --  9.1 9.1  ?PHOS  --   --  4.1 6.5*  --   ? ?CBC ?Recent Labs  ?Lab 09/20/21 ?1809 09/20/21 ?1817 09/20/21 ?2307 09/21/21 ?2131 09/22/21 ?0145  ?WBC 10.2  --  15.2* 10.7* 9.8  ?NEUTROABS  --   --  13.9*  --   --   ?HGB 11.1* 11.9* 10.7* 10.6* 10.5*  ?HCT 34.2* 35.0* 33.2* 33.0* 32.9*  ?MCV 96.1  --  97.6 98.5 96.8  ?PLT 186  --  174 168 170  ? ? ?  ?Medications:   ? ? atorvastatin  20 mg Oral QHS  ? Chlorhexidine Gluconate Cloth  6 each Topical  Q0600  ? docusate sodium  100 mg Oral BID  ? [START ON 09/23/2021] feeding supplement (NEPRO CARB STEADY)  237 mL Oral BID BM  ? hydrALAZINE  100 mg Oral Q8H  ? labetalol  300 mg Oral BID  ? levothyroxine  75 mcg Oral QAC breakfast  ? multivitamin  1 tablet Oral QHS  ? senna  1 tablet Oral BID  ? sertraline  150 mg Oral Daily  ? sevelamer carbonate  2,400 mg Oral TID WC  ? ? ? ?Madelon Lips MD ?09/22/2021, 9:35 AM   ?

## 2021-09-22 NOTE — Op Note (Signed)
09/20/2021 - 09/22/2021 ? ?2:52 PM ? ?PATIENT:  Katherine Ponce  ? ?MRN: [id number] ? ?PRE-OPERATIVE DIAGNOSIS:  Right Hip Fracture ? ?POST-OPERATIVE DIAGNOSIS:  * No post-op diagnosis entered * ? ?PROCEDURE:  Procedure(s): ?RIGHT POSTERIOR APPROACH HEMI HIP ARTHROPLASTY ? ?PREOPERATIVE INDICATIONS:  Katherine Ponce is an 81 y.o. female who was admitted 09/20/2021 with a diagnosis of Closed right hip fracture [facility]) and elected for surgical management.  The risks benefits and alternatives were discussed with the patient including but not limited to the risks of nonoperative treatment, versus surgical intervention including infection, bleeding, nerve injury, periprosthetic fracture, the need for revision surgery, dislocation, leg length discrepancy, blood clots, cardiopulmonary complications, morbidity, mortality, among others, and they were willing to proceed.  Predicted outcome is good, although there will be at least a six to nine month expected recovery.  ? ?OPERATIVE REPORT  ?   ?SURGEON:  Edmonia Lynch, MD ?   ?ASSISTANT:  Aggie Moats, PA-C, he was present and scrubbed throughout the case, critical for completion in a timely fashion, and for retraction, instrumentation, and closure. ? ?   ?ANESTHESIA:  General ?   ?COMPLICATIONS:  None.  ?   ? ?COMPONENTS:  [employer] A[employer]ade: Femoral stem: 6, Femoral Head:48, Neck:-4 ?  ?PROCEDURE IN DETAIL: The patient was met in the holding area and identified.  The appropriate hip  was marked at the operative site. The patient was then transported to the OR and  placed under general anesthesia.  At that point, the patient was  placed in the lateral decubitus position with the operative side up and  secured to the operating room table and all bony prominences padded.  ?   ?The operative lower extremity was prepped from the iliac crest to the toes.  Sterile draping was performed.  Time out was performed prior to incision.   ?   ?A routine posterolateral approach was utilized via sharp  dissection  carried down to the subcutaneous tissue.  Gross bleeders were Bovie  coagulated.  The iliotibial band was identified and incised  along the length of the skin incision.  Self-retaining retractors were  inserted. I examined the bursa there was significant hematoma and edema I performed a bursectomy here.  With the hip internally rotated, the short external rotators  were identified. The piriformis was tagged with FiberWire, and the hip capsule released in a T-type fashion. ? ?The femoral neck was exposed, and I resected the femoral neck using the appropriate jig. This was performed at approximately a thumb's breadth above the lesser trochanter. ?   ?I then exposed the deep acetabulum, cleared out any tissue including the ligamentum teres, and included the hip capsule in the FiberWire used above and below the T. ?   ?I then prepared the proximal femur using the cookie-cutter, the lateralizing reamer, and then sequentially broached. ? ?A trial utilized, and I reduced the hip and it was found to have excellent stability with functional range of motion. The trial components were then removed.  ? ?The canal and acetabulum were thoroughly irrigated ? ?I inserted the pressfit stem and placed the head and neck collar. The hip was reduced with appropriate force and was stable through a range of motion.  ? ?I then used a 2 mm drill bits to pass the FiberWire suture from the capsule and puriform is through the greater trochanter, and secured this. Excellent posterior capsular repair was achieved. I also closed the T in the capsule. ? ?I then irrigated the hip  copiously again with pulse lavage, and repaired the fascia with Vicryl, followed by Vicryl for the subcutaneous tissue, Monocryl for the skin, Steri-Strips and sterile gauze. The wounds were injected. The patient was then awakened and returned to PACU in stable and satisfactory condition. There were no complications. ? ?POST-OP PLAN: Weight bearing as  tolerated. DVT px will consist of SCD's and chemical px. ? ?Edmonia Lynch, MD ?Orthopedic Surgeon ?[phone]  ? ?09/22/2021 2:52 PM ? ?

## 2021-09-22 NOTE — Progress Notes (Signed)
SLP Cancellation Note ? ?Patient Details ?Name: Katherine Ponce ?MRN: [id number] ?DOB: Dec 24, 1940 ? ? ?Cancelled treatment:       Reason Eval/Treat Not Completed: Patient at procedure or test/unavailable (HD). Will follow up for swallow eval as able.  ? ? ? ?Osie Bond., M.A. CCC-SLP ?Acute Rehabilitation Services ?Office [phone] ? ?Secure chat preferred ? ?09/22/2021, 8:11 AM ?

## 2021-09-22 NOTE — Interval H&P Note (Signed)
History and Physical Interval Note: ? ?09/22/2021 ?6:47 AM ? ?Katherine Ponce  has presented today for surgery, with the diagnosis of Right Hip Fracture.  The various methods of treatment have been discussed with the patient and family. After consideration of risks, benefits and other options for treatment, the patient has consented to  Procedure(s): ?RIGHT POSTERIOR APPROACH HEMI HIP ARTHROPLASTY (Right) as a surgical intervention.  The patient's history has been reviewed, patient examined, no change in status, stable for surgery.  I have reviewed the patient's chart and labs.  Questions were answered to the patient's satisfaction.   ? ? ?Renette Butters ? ? ?

## 2021-09-22 NOTE — Discharge Instructions (Addendum)
You may bear weight as tolerated. ?Keep your dressing on and dry until follow up. ?Take medicine to prevent blood clots as directed. ?Take pain medicine as needed with the goal of transitioning to over the counter medicines.  ? ? ?INSTRUCTIONS AFTER JOINT REPLACEMENT  ? ?Remove items at home which could result in a fall. This includes throw rugs or furniture in walking pathways ?ICE to the affected joint every three hours while awake for 30 minutes at a time, for at least the first 3-5 days, and then as needed for pain and swelling.  Continue to use ice for pain and swelling. You may notice swelling that will progress down to the foot and ankle.  This is normal after surgery.  Elevate your leg when you are not up walking on it.   ?Continue to use the breathing machine you got in the hospital (incentive spirometer) which will help keep your temperature down.  It is common for your temperature to cycle up and down following surgery, especially at night when you are not up moving around and exerting yourself.  The breathing machine keeps your lungs expanded and your temperature down. ? ? ?DIET:  As you were doing prior to hospitalization, we recommend a well-balanced diet. ? ?DRESSING / WOUND CARE / SHOWERING ? ?You may shower 3 days after surgery, but keep the wounds dry during showering.  You may use an occlusive plastic wrap (Press'n Seal for example) with blue painter's tape at edges, NO SOAKING/SUBMERGING IN THE BATHTUB.  If the bandage gets wet, call the office.  ? ?ACTIVITY ? ?Increase activity slowly as tolerated, but follow the weight bearing instructions below.   ?No driving for 6 weeks or until further direction given by your physician.  You cannot drive while taking narcotics.  ?No lifting or carrying greater than 10 lbs. until further directed by your surgeon. ?Avoid periods of inactivity such as sitting longer than an hour when not asleep. This helps prevent blood clots.  ?You may return to work once you  are authorized by your doctor.  ? ? ?WEIGHT BEARING  ? ?Weight bearing as tolerated with assist device (walker, cane, etc) as directed, use it as long as suggested by your surgeon or therapist, typically at least 4-6 weeks. ? ?CONSTIPATION ? ?Constipation is defined medically as fewer than three stools per week and severe constipation as less than one stool per week.  Even if you have a regular bowel pattern at home, your normal regimen is likely to be disrupted due to multiple reasons following surgery.  Combination of anesthesia, postoperative narcotics, change in appetite and fluid intake all can affect your bowels.  ? ?YOU MUST use at least one of the following options; they are listed in order of increasing strength to get the job done.  They are all available over the counter, and you may need to use some, POSSIBLY even all of these options:   ? ?Drink plenty of fluids (prune juice may be helpful) and high fiber foods ?Colace 100 mg by mouth twice a day  ?Senokot for constipation as directed and as needed Dulcolax (bisacodyl), take with full glass of water  ?Miralax (polyethylene glycol) once or twice a day as needed. ? ?If you have tried all these things and are unable to have a bowel movement in the first 3-4 days after surgery call either your surgeon or your primary doctor.   ? ?If you experience loose stools or diarrhea, hold the medications until you stool  forms back up.  If your symptoms do not get better within 1 week or if they get worse, check with your doctor.  If you experience "the worst abdominal pain ever" or develop nausea or vomiting, please contact the office immediately for further recommendations for treatment. ? ? ?MEDICATIONS:  See your medication summary on the ?After Visit Summary? that nursing will review with you.  You may have some home medications which will be placed on hold until you complete the course of blood thinner medication.  It is important for you to complete the blood  thinner medication as prescribed. ? ?Take medicines as prescribed.   You have several different medicines that work in different ways. ?- Tylenol is for mild to moderate pain. Try to take this medicine before turning to your narcotic medicines.  ?- Norco is a narcotic pain medicine.  Take this for severe pain. This medicine can be dehydrating / constipating. ?- Aspirin is to prevent blood clots after surgery.  ? ?PRECAUTIONS:  If you experience chest pain or shortness of breath - call 911 immediately for transfer to the hospital emergency department.  ? ?If you develop a fever greater that 101 F, purulent drainage from wound, increased redness or drainage from wound, foul odor from the wound/dressing, or calf pain - CONTACT YOUR SURGEON.   ?                                                ?FOLLOW-UP APPOINTMENTS:  If you do not already have a post-op appointment, please call the office [phone] for an appointment to be seen by Dr. Percell Miller in 2 weeks.  ? ?OTHER INSTRUCTIONS:  ? ?MAKE SURE YOU:  ?Understand these instructions.  ?Get help right away if you are not doing well or get worse.  ? ? ?Thank you for letting us be a part of your medical care team.  It is a privilege we respect greatly.  We hope these instructions will help you stay on track for a fast and full recovery!  ? ? ?POST-OPERATIVE OPIOID TAPER INSTRUCTIONS: ?It is important to wean off of your opioid medication as soon as possible. If you do not need pain medication after your surgery it is ok to stop day one. ?Opioids include: ?Codeine, Hydrocodone(Norco, Vicodin), Oxycodone(Percocet, oxycontin) and hydromorphone amongst others.  ?Long term and even short term use of opiods can cause: ?Increased pain response ?Dependence ?Constipation ?Depression ?Respiratory depression ?And more.  ?Withdrawal symptoms can include ?Flu like symptoms ?Nausea, vomiting ?And more ?Techniques to manage these symptoms ?Hydrate well ?Eat regular healthy meals ?Stay  active ?Use relaxation techniques(deep breathing, meditating, yoga) ?Do Not substitute Alcohol to help with tapering ?If you have been on opioids for less than two weeks and do not have pain than it is ok to stop all together.  ?Plan to wean off of opioids ?This plan should start within one week post op of your joint replacement. ?Maintain the same interval or time between taking each dose and first decrease the dose.  ?Cut the total daily intake of opioids by one tablet each day ?Next start to increase the time between doses. ?The last dose that should be eliminated is the evening dose.   ?

## 2021-09-22 NOTE — Progress Notes (Signed)
Initial Nutrition Assessment ? ?DOCUMENTATION CODES:  ?Not applicable ? ?INTERVENTION:  ?Once diet is advanced: ?-Nepro Shake po BID, each supplement provides 425 kcal and 19 grams protein ?-renal mvi daily ? ?NUTRITION DIAGNOSIS:  ?Increased nutrient needs related to chronic illness (ESRD on HD) as evidenced by estimated needs. ? ?GOAL:  ?Patient will meet greater than or equal to 90% of their needs ? ?MONITOR:  ?Diet advancement, Weight trends, Labs ? ?REASON FOR ASSESSMENT:  ?Consult ?Hip fracture protocol ? ?ASSESSMENT:  ?Pt with PMH significant for dementia, ESRD on HD, HTN, HLD, hypothyroidism, h/o of brain aneurysm repair, and CHF admitted with closed R hip fx s/p mechanical fall. ? ?Pt out of room, in HD. Unable to obtain detailed diet/weight history at this time and will obtain at follow-up. Per MD, pt to have surgery today as well.  ? ?No weight history available for review and unknown EDW. ? ?PO Intake: none documented  ? ?UOP: none documented x24 hours ?I/O: +47.82ml since admit ? ?Medications: ? docusate sodium  100 mg Oral BID  ? senna  1 tablet Oral BID  ? sevelamer carbonate  2,400 mg Oral TID WC  ? ?Labs: ?Recent Labs  ?Lab 09/20/21 ?1809 09/20/21 ?1817 09/20/21 ?2307 09/21/21 ?2131 09/22/21 ?0145  ?NA 140 140  --  141 140  ?K 4.9 4.8  --  4.8 4.8  ?CL 103 102  --  104 106  ?CO2 26  --   --  27 25  ?BUN 17 22  --  31* 34*  ?CREATININE 3.84* 3.80*  --  5.86* 6.16*  ?CALCIUM 9.0  --   --  9.1 9.1  ?MG  --   --  1.9  --   --   ?PHOS  --   --  4.1 6.5*  --   ?GLUCOSE 93 95  --  85 77  ?Hgb 10.5  ? ?NUTRITION - FOCUSED PHYSICAL EXAM: ?Unable to perform at this time. Will attempt at follow-up. ? ?Diet Order:   ?Diet Order   ? ?       ?  Diet NPO time specified  Diet effective now       ?  ? ?  ?  ? ?  ? ?EDUCATION NEEDS:  ?No education needs have been identified at this time ? ?Skin:  Skin Assessment: Reviewed RN Assessment ? ?Last BM:  PTA ? ?Height:  ?Ht Readings from Last 1 Encounters:  ?09/20/21 5\' 6"   (1.676 m)  ? ?Weight:  ?Wt Readings from Last 1 Encounters:  ?09/22/21 59.5 kg  ? ?BMI:  Body mass index is 21.17 kg/m?. ? ?Estimated Nutritional Needs:  ?Kcal:  1800-2000 ?Protein:  90-100 grams ?Fluid:  >1.8L ? ? ?Theone Stanley., MS, RD, LDN (she/her/hers) ?RD pager number and weekend/on-call pager number located in [place]. ?

## 2021-09-22 NOTE — Anesthesia Preprocedure Evaluation (Signed)
Anesthesia Evaluation  ?Patient identified by MRN, date of birth, ID band ?Patient awake ? ? ? ?Reviewed: ?Allergy & Precautions, Patient's Chart, lab work & pertinent test results ? ?Airway ?Mallampati: II ? ?TM Distance: >3 FB ? ? ? ? Dental ?  ?Pulmonary ?COPD, Current Smoker and Patient abstained from smoking.,  ?  ?breath sounds clear to auscultation ? ? ? ? ? ? Cardiovascular ?hypertension, +CHF  ? ?Rhythm:Regular Rate:Normal ? ? ?  ?Neuro/Psych ?PSYCHIATRIC DISORDERS CVA   ? GI/Hepatic ?negative GI ROS, Neg liver ROS,   ?Endo/Other  ?Hypothyroidism  ? Renal/GU ?Renal disease  ? ?  ?Musculoskeletal ? ? Abdominal ?  ?Peds ? Hematology ?  ?Anesthesia Other Findings ? ? Reproductive/Obstetrics ? ?  ? ? ? ? ? ? ? ? ? ? ? ? ? ?  ?  ? ? ? ? ? ? ? ? ?Anesthesia Physical ?Anesthesia Plan ? ?ASA: 3 ? ?Anesthesia Plan: General  ? ?Post-op Pain Management:   ? ?Induction: Intravenous ? ?PONV Risk Score and Plan: 2 and Treatment may vary due to age or medical condition and Ondansetron ? ?Airway Management Planned: Oral ETT ? ?Additional Equipment:  ? ?Intra-op Plan:  ? ?Post-operative Plan:  ? ?Informed Consent: I have reviewed the patients History and Physical, chart, labs and discussed the procedure including the risks, benefits and alternatives for the proposed anesthesia with the patient or authorized representative who has indicated his/her understanding and acceptance.  ? ? ? ?Dental advisory given ? ?Plan Discussed with: Anesthesiologist and CRNA ? ?Anesthesia Plan Comments:   ? ? ? ? ? ? ?Anesthesia Quick Evaluation ? ?

## 2021-09-23 ENCOUNTER — Inpatient Hospital Stay (HOSPITAL_COMMUNITY): Payer: Medicare Other

## 2021-09-23 ENCOUNTER — Other Ambulatory Visit: Payer: Self-pay

## 2021-09-23 DIAGNOSIS — E782 Mixed hyperlipidemia: Secondary | ICD-10-CM

## 2021-09-23 DIAGNOSIS — I5022 Chronic systolic (congestive) heart failure: Secondary | ICD-10-CM | POA: Diagnosis not present

## 2021-09-23 DIAGNOSIS — S72001D Fracture of unspecified part of neck of right femur, subsequent encounter for closed fracture with routine healing: Secondary | ICD-10-CM | POA: Diagnosis not present

## 2021-09-23 DIAGNOSIS — I10 Essential (primary) hypertension: Secondary | ICD-10-CM | POA: Diagnosis not present

## 2021-09-23 DIAGNOSIS — R569 Unspecified convulsions: Secondary | ICD-10-CM

## 2021-09-23 DIAGNOSIS — N186 End stage renal disease: Secondary | ICD-10-CM | POA: Diagnosis not present

## 2021-09-23 DIAGNOSIS — F0390 Unspecified dementia without behavioral disturbance: Secondary | ICD-10-CM | POA: Diagnosis not present

## 2021-09-23 LAB — BASIC METABOLIC PANEL
Anion gap: 12 (ref 5–15)
BUN: 24 mg/dL — ABNORMAL HIGH (ref 8–23)
CO2: 25 mmol/L (ref 22–32)
Calcium: 8.6 mg/dL — ABNORMAL LOW (ref 8.9–10.3)
Chloride: 101 mmol/L (ref 98–111)
Creatinine, Ser: 3.77 mg/dL — ABNORMAL HIGH (ref 0.44–1.00)
GFR, Estimated: 12 mL/min — ABNORMAL LOW (ref >60)
Glucose, Bld: 86 mg/dL (ref 70–99)
Potassium: 4.3 mmol/L (ref 3.5–5.1)
Sodium: 138 mmol/L (ref 135–145)

## 2021-09-23 LAB — CBC
HCT: 29.7 % — ABNORMAL LOW (ref 36.0–46.0)
Hemoglobin: 9.3 g/dL — ABNORMAL LOW (ref 12.0–15.0)
MCH: 30.5 pg (ref 26.0–34.0)
MCHC: 31.3 g/dL (ref 30.0–36.0)
MCV: 97.4 fL (ref 80.0–100.0)
Platelets: 134 10*3/uL — ABNORMAL LOW (ref 150–400)
RBC: 3.05 MIL/uL — ABNORMAL LOW (ref 3.87–5.11)
RDW: 14.6 % (ref 11.5–15.5)
WBC: 9.1 10*3/uL (ref 4.0–10.5)
nRBC: 0 % (ref 0.0–0.2)

## 2021-09-23 LAB — HEPATITIS B SURFACE ANTIBODY, QUANTITATIVE: Hep B S AB Quant (Post): <3.1 m[IU]/mL — ABNORMAL LOW (ref >9.9)

## 2021-09-23 LAB — GLUCOSE, CAPILLARY: Glucose-Capillary: 111 mg/dL — ABNORMAL HIGH (ref 70–99)

## 2021-09-23 MED ORDER — ORAL CARE MOUTH RINSE
15.0000 mL | OROMUCOSAL | Status: DC
  Administered 2021-09-23 – 2021-09-24 (×8): 15 mL via OROMUCOSAL

## 2021-09-23 MED ORDER — CHLORHEXIDINE GLUCONATE 0.12% ORAL RINSE (MEDLINE KIT)
15.0000 mL | Freq: Two times a day (BID) | OROMUCOSAL | Status: DC
  Administered 2021-09-23 – 2021-09-28 (×5): 15 mL via OROMUCOSAL

## 2021-09-23 MED ORDER — HYDRALAZINE HCL 20 MG/ML IJ SOLN
10.0000 mg | Freq: Four times a day (QID) | INTRAMUSCULAR | Status: DC | PRN
  Administered 2021-09-23: 10 mg via INTRAVENOUS
  Filled 2021-09-23: qty 1

## 2021-09-23 MED ORDER — LORAZEPAM 2 MG/ML IJ SOLN
4.0000 mg | INTRAMUSCULAR | Status: AC | PRN
  Administered 2021-09-23 (×2): 2 mg via INTRAVENOUS
  Filled 2021-09-23: qty 2

## 2021-09-23 MED ORDER — LORAZEPAM 2 MG/ML IJ SOLN
INTRAMUSCULAR | Status: AC
  Filled 2021-09-23: qty 1

## 2021-09-23 MED ORDER — ACETAMINOPHEN 325 MG PO TABS
650.0000 mg | ORAL_TABLET | ORAL | Status: DC | PRN
  Administered 2021-09-23 – 2021-09-28 (×3): 650 mg via ORAL
  Filled 2021-09-23 (×4): qty 2

## 2021-09-23 MED ORDER — SODIUM CHLORIDE 0.9 % IV SOLN
75.0000 mL/h | INTRAVENOUS | Status: DC
  Administered 2021-09-23: 75 mL/h via INTRAVENOUS
  Administered 2021-09-24: 5 mL/h via INTRAVENOUS

## 2021-09-23 MED ORDER — HEPARIN SODIUM (PORCINE) 5000 UNIT/ML IJ SOLN
5000.0000 [IU] | Freq: Three times a day (TID) | INTRAMUSCULAR | Status: DC
  Administered 2021-09-23 – 2021-09-28 (×16): 5000 [IU] via SUBCUTANEOUS
  Filled 2021-09-23 (×16): qty 1

## 2021-09-23 MED ORDER — LORAZEPAM 2 MG/ML IJ SOLN: 2.0000 mg | Freq: Once | INTRAMUSCULAR | Status: DC

## 2021-09-23 MED ORDER — LEVETIRACETAM IN NACL 1000 MG/100ML IV SOLN
1000.0000 mg | Freq: Once | INTRAVENOUS | Status: AC
  Administered 2021-09-23: 1000 mg via INTRAVENOUS
  Filled 2021-09-23: qty 100

## 2021-09-23 MED ORDER — LEVETIRACETAM IN NACL 500 MG/100ML IV SOLN
500.0000 mg | Freq: Two times a day (BID) | INTRAVENOUS | Status: DC
  Administered 2021-09-24: 500 mg via INTRAVENOUS
  Filled 2021-09-23 (×3): qty 100

## 2021-09-23 NOTE — Procedures (Signed)
Patient Name: Katherine Ponce  ?MRN: [id number]  ?Epilepsy Attending: Lora Havens  ?Referring Physician/Provider: Lang Snow, NP ?Date: 09/23/2021 ?Duration: 22.57 mins ? ?Patient history: 81 y.o female with PMH of chronic systolic HF, ESRD on HD TTS, HTN, HLD, Hypothyroidism, brain aneurysm admitted with acute right hip fracture secondary to mechanical fall and small tSAH/SDH now complicated by seizure like activity. EEG to evaluate for seizure ? ?Level of alertness: Awake, asleep ? ?AEDs during EEG study: LEV, Ativan ? ?Technical aspects: This EEG study was done with scalp electrodes positioned according to the 10-20 International system of electrode placement. Electrical activity was acquired at a sampling rate of 500Hz  and reviewed with a high frequency filter of 70Hz  and a low frequency filter of 1Hz . EEG data were recorded continuously and digitally stored.  ? ?Description: The posterior dominant rhythm consists of 9 Hz activity of moderate voltage (25-35 uV) seen predominantly in posterior head regions, symmetric and reactive to eye opening and eye closing. Sleep was characterized by vertex waves, sleep spindles (12 to 14 Hz), maximal frontocentral region.  EEG showed continuous generalized and maximal left frontal region 3-6hz  theta-delta slowing admixed with sharply contoured 6-7hz  theta slowing in left frontal region consistent with breach artifact.  ? Hyperventilation and photic stimulation were not performed.    ? ?ABNORMALITY ?- Continuous slow, generalized and maximal left frontal region ?- Breach artifact, left frontal region ? ?IMPRESSION: ?This study is suggestive of cortical dysfunction arising from left frontal region consistent with prior craniotomy. Additionally there is moderate diffuse encephalopathy, nonspecific etiology. No seizures or definite epileptiform discharges were seen throughout the recording. ? ?Lora Havens  ? ?

## 2021-09-23 NOTE — Significant Event (Signed)
Rapid Response Event Note  ? ?Reason for Call :  unresponsiveness ?Initial Focused Assessment:  ?Nursing staff notified me of unresponsive patient. Staff witnessed "shaking over whole body" and then unresponsiveness. Unknown length of time for episode of shaking. Pt is now arousable to open eyes and localize to noxious stimulus. Pt is spontaneously breathing and able to maintain oxygen saturations. Pt has known small right SAH post fall. CBG 111. ? ?0201-98.52F, SR 93, 141/69, RR 16 with sats 100% on NRB mask at 15L.  ? ?Wean pt back to 3L as tolerated when more awake.  ? ? ?Interventions:  ?-No interventions per RRRN ? ? ? ?MD Notified: per nursing staff ?Call Time: [id number] ?Arrival Time: 0204 ?End Time: 0222 ? ?Madelynn Done, RN ?

## 2021-09-23 NOTE — Progress Notes (Signed)
ORTHOPAEDIC PROGRESS NOTE ? ?s/p Procedure(s): ?RIGHT POSTERIOR APPROACH HEMI HIP ARTHROPLASTY on 4/6 ? ?SUBJECTIVE: ?Patient had a seizure overnight and is now very somnolent.  ? ?OBJECTIVE: ?PE: ?RLE: dressing CDI. Leg lengths equal. Unable to fully test motor function due to patient status, but did appreciate a slight flicker of movement in her toes. Warm well perfused foot ? ?Vitals:  ? 09/23/21 1109 09/23/21 1210  ?BP: (!) 175/73 134/64  ?Pulse:    ?Resp: 18 16  ?Temp:    ?SpO2: 100%   ? ? ? ?ASSESSMENT: ?Katherine Ponce is a 81 y.o. female POD#1 with new onset seizure ? ?PLAN: ?Weightbearing: WBAT ?Insicional and dressing care: Reinforce dressings as needed ?Orthopedic device(s): None ?Showering:Hold for now ?VTE prophylaxis: Heparin per primary team ?Pain control: PRN pain medications, minimize as able ?Follow - up plan: 2 weeks in office ?Contact information:  After hours and holidays please check [email or web address] for group call information for [facility] ? 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  ? ?Noemi Chapel, PA-C ?09/23/21 ? ? ?

## 2021-09-23 NOTE — Plan of Care (Signed)

## 2021-09-23 NOTE — Progress Notes (Signed)
PT Cancellation Note ? ?Patient Details ?Name: Katherine Ponce ?MRN: [id number] ?DOB: 09/04/1940 ? ? ?Cancelled Treatment:    Reason Eval/Treat Not Completed: Patient not medically ready. Spoke with Dr. Karleen Hampshire who request holding PT/OT Evaluations until Monday 09/26/21. Will follow-up then. ? ?Mabeline Caras, PT, DPT ?Acute Rehabilitation Services  ?Pager [phone] ?Office [phone] ? ?Derry Lory ?09/23/2021, 9:05 AM ?

## 2021-09-23 NOTE — Progress Notes (Signed)
SLP Cancellation Note ? ?Patient Details ?Name: Katherine Ponce ?MRN: [id number] ?DOB: Dec 11, 1940 ? ? ?Cancelled treatment:        Checked on pt this am for BSE. Pt not appropriate at that time. Spoke with husband who reported pt had a seizure last night and is unarousable (appeared that way when therapist looked at pt). She also wearing a Venturi mask. Will continue efforts.  ? ? ?Houston Siren ?09/23/2021, 2:58 PM ?

## 2021-09-23 NOTE — Progress Notes (Signed)
Spoke to pt's husband via phone. Pt receives out-pt HD at [facility] on [address]. Pt's husband provides transportation to/from appts. Will assist as needed. ? ?Melven Sartorius ?Renal Navigator ?[phone] ?

## 2021-09-23 NOTE — Progress Notes (Addendum)
? ?Progress Note ? ?Patient Name: Katherine Ponce ?Date of Encounter: 09/23/2021 ? ?[facility] Cardiologist: new to Dr. Gwenlyn Found ? ?Subjective  ? ?Unable to answer any questions. Does not awake to stimuli. Significant event early this morning with rapid response called.  ? ?Inpatient Medications  ?  ?Scheduled Meds: ? acetaminophen  500 mg Oral Q6H  ? amLODipine  5 mg Oral Daily  ? atorvastatin  20 mg Oral QHS  ? chlorhexidine gluconate (MEDLINE KIT)  15 mL Mouth Rinse BID  ? Chlorhexidine Gluconate Cloth  6 each Topical Q0600  ? docusate sodium  100 mg Oral BID  ? enoxaparin (LOVENOX) injection  30 mg Subcutaneous Q24H  ? feeding supplement (NEPRO CARB STEADY)  237 mL Oral BID BM  ? hydrALAZINE  100 mg Oral Q8H  ? labetalol  300 mg Oral BID  ? levothyroxine  75 mcg Oral QAC breakfast  ? LORazepam      ? LORazepam  2 mg Intravenous Once  ? mouth rinse  15 mL Mouth Rinse 10 times per day  ? multivitamin  1 tablet Oral QHS  ? senna  1 tablet Oral BID  ? sertraline  150 mg Oral Daily  ? sevelamer carbonate  2,400 mg Oral TID WC  ? ?Continuous Infusions: ? sodium chloride    ? sodium chloride    ? sodium chloride 75 mL/hr (09/23/21 0556)  ? levETIRAcetam    ? methocarbamol (ROBAXIN) IV    ? ?PRN Meds: ?sodium chloride, sodium chloride, albuterol, alteplase, heparin, HYDROcodone-acetaminophen, HYDROcodone-acetaminophen, labetalol, lidocaine (PF), lidocaine-prilocaine, menthol-cetylpyridinium **OR** phenol, methocarbamol **OR** methocarbamol (ROBAXIN) IV, metoCLOPramide **OR** metoCLOPramide (REGLAN) injection, morphine injection, ondansetron **OR** ondansetron (ZOFRAN) IV, pentafluoroprop-tetrafluoroeth, polyethylene glycol  ? ?Vital Signs  ?  ?Vitals:  ? 09/23/21 0201 09/23/21 0446 09/23/21 0654 09/23/21 0745  ?BP: (!) 141/69 (!) 165/69 (!) 180/80 (!) 184/78  ?Pulse: 93 63    ?Resp: 16 14    ?Temp:      ?TempSrc:      ?SpO2: 100% 100%    ?Weight:      ?Height:      ? ? ?Intake/Output Summary (Last 24 hours) at 09/23/2021  0816 ?Last data filed at 09/22/2021 1653 ?Gross per 24 hour  ?Intake 500 ml  ?Output 71 ml  ?Net 429 ml  ? ? ?  09/22/2021  ? 11:30 AM 09/22/2021  ?  7:27 AM 09/20/2021  ?  6:16 PM  ?Last 3 Weights  ?Weight (lbs) 131 lb 13.4 oz 131 lb 2.8 oz 130 lb  ?Weight (kg) 59.8 kg 59.5 kg 58.968 kg  ?   ? ?Telemetry  ?  ?NSR without significant ventricular ectopy. There were 2 episodes of motion artifact around 2AM and 4AM, underlying rhythm was sinus rhythm - Personally Reviewed ? ?ECG  ?  ?NSR with early repol. Mildly depressed ST segment in lead V5-V6 - Personally Reviewed ? ?Physical Exam  ? ?GEN: did not wake up to stimuli   ?Neck: No JVD ?Cardiac: RRR, no murmurs, rubs, or gallops.  ?Respiratory: anterior exam clear to auscultation bilaterally. ?GI: Soft, non-distended  ?MS: No edema; No deformity. ?Neuro:  unable to assess  ?Psych: unresponsive ? ?Labs  ?  ?High Sensitivity Troponin:   ?Recent Labs  ?Lab 09/20/21 ?2307 09/21/21 ?0158 09/21/21 ?0441  ?TROPONINIHS 39* 39* 41*  ?   ?Chemistry ?Recent Labs  ?Lab 09/20/21 ?1809 09/20/21 ?1817 09/20/21 ?2307 09/21/21 ?2131 09/22/21 ?0145 09/22/21 ?1457 09/22/21 ?1854 09/23/21 ?0134  ?NA 140   < >  --  141 140 138  --  138  ?K 4.9   < >  --  4.8 4.8 3.7  --  4.3  ?CL 103   < >  --  104 106 98  --  101  ?CO2 26  --   --  27 25  --   --  25  ?GLUCOSE 93   < >  --  85 77 71  --  86  ?BUN 17   < >  --  31* 34* 15  --  24*  ?CREATININE 3.84*   < >  --  5.86* 6.16* 2.90* 3.31* 3.77*  ?CALCIUM 9.0  --   --  9.1 9.1  --   --  8.6*  ?MG  --   --  1.9  --   --   --   --   --   ?PROT 6.0*  --   --   --   --   --   --   --   ?ALBUMIN 3.3*  --   --  3.0* 3.1*  --   --   --   ?AST 36  --   --   --   --   --   --   --   ?ALT 12  --   --   --   --   --   --   --   ?ALKPHOS 93  --   --   --   --   --   --   --   ?BILITOT 1.1  --   --   --   --   --   --   --   ?GFRNONAA 11*  --   --  7* 6*  --  14* 12*  ?ANIONGAP 11  --   --  10 9  --   --  12  ? < > = values in this interval not displayed.  ?   ?Lipids No results for input(s): CHOL, TRIG, HDL, LABVLDL, LDLCALC, CHOLHDL in the last 168 hours.  ?Hematology ?Recent Labs  ?Lab 09/22/21 ?0145 09/22/21 ?5974 09/22/21 ?1854 09/23/21 ?0134  ?WBC 9.8  --  12.1* 9.1  ?RBC 3.40*  --  3.17* 3.05*  ?HGB 10.5* 10.5* 9.7* 9.3*  ?HCT 32.9* 31.0* 30.9* 29.7*  ?MCV 96.8  --  97.5 97.4  ?MCH 30.9  --  30.6 30.5  ?MCHC 31.9  --  31.4 31.3  ?RDW 14.7  --  14.5 14.6  ?PLT 170  --  142* 134*  ? ?Thyroid  ?Recent Labs  ?Lab 09/20/21 ?2307  ?TSH 3.951  ?  ?BNPNo results for input(s): BNP, PROBNP in the last 168 hours.  ?DDimer No results for input(s): DDIMER in the last 168 hours.  ? ?Radiology  ?  ?CT HEAD WO CONTRAST (5MM) ? ?Result Date: 09/23/2021 ?CLINICAL DATA:  Hemorrhagic stroke EXAM: CT HEAD WITHOUT CONTRAST TECHNIQUE: Contiguous axial images were obtained from the base of the skull through the vertex without intravenous contrast. RADIATION DOSE REDUCTION: This exam was performed according to the departmental dose-optimization program which includes automated exposure control, adjustment of the mA and/or kV according to patient size and/or use of iterative reconstruction technique. COMPARISON:  Head CT 09/20/2021. FINDINGS: Brain: There is no mass, hemorrhage or extra-axial collection. There is generalized atrophy without lobar predilection. Hypodensity of the white matter is most commonly associated with chronic microvascular disease. Old left MCA territory infarct with ex vacuo dilatation of the frontal  horn of the left lateral ventricle. Vascular: Multiple vascular clips at the skull base. Skull: Remote left craniotomy. Sinuses/Orbits: Right-sided sinus disease, unchanged. The orbits are normal. IMPRESSION: 1. No acute intracranial abnormality. 2. 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 Electronically Signed   By: Ulyses Jarred M.D.   On: 09/23/2021 03:32  ? ?DG Pelvis Portable ? ?Result Date: 09/22/2021 ?CLINICAL DATA:  Postop EXAM:  PORTABLE PELVIS 1-2 VIEWS COMPARISON:  Radiographs dated September 20, 2021 FINDINGS: Interval right hip arthroplasty with intact hardware. No periprosthetic fracture. Cephalomedullary nail in the left hip. IMPRESSION: Status post right hip arthroplasty without acute complications. Electronically Signed   By: Keane Police D.O.   On: 09/22/2021 18:24  ? ?ECHOCARDIOGRAM COMPLETE ? ?Result Date: 09/21/2021 ?   ECHOCARDIOGRAM REPORT   Patient Name:   ANELLE PARLOW Date of Exam: 09/21/2021 Medical Rec #:  [id number]     Height:       66.0 in Accession #:    [id number]    Weight:       130.0 lb Date of Birth:  1941-02-11      BSA:          1.665 m? Patient Age:    67 years      BP:           167/69 mmHg Patient Gender: F             HR:           69 bpm. Exam Location:  Inpatient Procedure: 2D Echo, Color Doppler, Cardiac Doppler and 3D Echo Indications:    Abnormal ECG R94.31  History:        Patient has no prior history of Echocardiogram examinations.                 CHF, Stroke, Pulmonary HTN and COPD; Risk Factors:Hypertension                 and Dyslipidemia.  Sonographer:    Bernadene Person RDCS Referring Phys: [place]  1. Left ventricular ejection fraction, by estimation, is 30 to 35%. The left ventricle has moderately decreased function. The left ventricle demonstrates global hypokinesis. The left ventricular internal cavity size was moderately dilated. There is mild  left ventricular hypertrophy. Left ventricular diastolic parameters are consistent with Grade II diastolic dysfunction (pseudonormalization). Elevated left atrial pressure.  2. Right ventricular systolic function is normal. The right ventricular size is moderately enlarged. There is mildly elevated pulmonary artery systolic pressure.  3. Left atrial size was severely dilated.  4. Right atrial size was mildly dilated.  5. The mitral valve is degenerative. Mild to moderate mitral valve regurgitation. No evidence of mitral stenosis.  Moderate mitral annular calcification.  6. The aortic valve is tricuspid. There is mild calcification of the aortic valve. There is mild thickening of the aortic valve. Aortic valve regurgitation is trivial. Aorti

## 2021-09-23 NOTE — Progress Notes (Signed)
Responded to code blue for this patient. Nurses present at bedside. IMTS at bedside. Patient was somnolent, respond to painful stimuli only. Vitals stable. Per nurse, pt was alert and talkative then suddenly began generalized seizure, eyes rolled; which lasted less than 30 seconds. Lorazepam was not administered during seizure episode. Patient maintained pulse throughout event. No compressions were performed. On exam, heart sounds normal. Bag mask present, saturating well. No further seizure episodes witnessed. Primary team notified. ?

## 2021-09-23 NOTE — Progress Notes (Signed)
Patient became  more alert and arousable.  ?Able to verbalized name of husband who was at bedside along with her name and DOB. She also verbalized head pain.   ? ?Because she is more responsive, advanced diet to ice chips and water. Tylenol was crushed and given in applesauce. Patient tolerated well.  ? ?Patient tapered from NRB to 3L of oxygen via [place]. Patient saturations remained >96%.  ? ?

## 2021-09-23 NOTE — Progress Notes (Addendum)
Sitter notified RN that patient had a shaking episode. RN noted patient was unresponsive but had a pulse.VS and CBG obtained at 0201 see MAR.Rapid Response notified. NRB mask at 15L applied. Will continue to monitor. NP on-call notified.    ?

## 2021-09-23 NOTE — Progress Notes (Signed)
Rapid response called for two episodes of unresponsiveness. Per staff, patient witnessed with what appears to be generalized tonic clonic seizure like activity lasting less than 1 minute followed by post ictal phase. No report of loss of bladder or bowel function, no tongue biting however brief loss of consciousness noted. ? ?Vitals; 98.26F, SR 93, 141/69, RR 16 with sats 100% on NRB mask at 15L.  ? ? ?81 y.o female with PMH of chronic systolic HF, ESRD on HD TTS, HTN, HLD, Hypothyroidism, brain aneurysm admitted with acute right hip fracture secondary to mechanical fall and small tSAH/SDH now complicated by seizure like activity. ? ? ?Seizure Like Activity ?-STAT CT Head shows old LMCA stroke otherwise no acute intracranial abnormality ?-Supplemental O2 as needed to maintain O2 saturations 88 to 92% ?-Continuous cardiac monitoring ?-Check Labs CBC, Chem ?-EKG ?-Load with Keppra 1g IV then continue 500 mg BID ?-PRN Lorazepam for breakthrough seizures ?-Seizure precaution ?-EEG pending ?-MRI Brain ?-Neurology consult ?-may need to transfer to ICU f continues to  have seizure>5 min due to high risk for intubation ? ? ? ?Rufina Falco, DNP, CCRN, FNP-C, AGACNP-BC ?Acute Care Nurse Practitioner  ?[facility] ?Pager: [phone] ?[place] [facility] ? ? ?  ? ?

## 2021-09-23 NOTE — Progress Notes (Signed)
? ? ? [facility] ?                                                                            ? ? ?Katherine Ponce, is a 81 y.o. female, DOB - 08-28-1940, IRW:[id number] ?Admit date - 09/20/2021    ?Outpatient Primary MD for the patient is Kristopher Glee., MD ? ?LOS - 3  days ? ? ? ?Brief summary  ? 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 She was found to have a subcapital acute fracture of the right proximal femur, underwent surgical repair. Post op, around 4 am pt had two episodes of unresponsiveness followed by generalized tonic clonic seizures lasting less than a minute. EEG ordered and neurology consulted for recommendations. She was given a dose of IV ativan and IV keppra loading dose followed by BID IV keppra.  ? ? ?Assessment & Plan  ? ? ?Assessment and Plan: ? ? ? ?Generalized tonic-clonic seizures followed by postictal somnolence. ?Patient received IV Ativan and IV Keppra 1 g followed by Keppra 500 mg twice daily. ?EEG was done shows continuous slow, generalized from the left frontal area where she had prior craniotomy.  She also has moderate diffuse encephalopathy of nonspecific etiology. ?Neurology consulted will wait for recommendations. ?CT of the head shows old strokes, no acute strokes seen. ?Unable to do MRI of the brain without contrast as patient has a history of brain aneurysm repair with metal clippings from [place]. ?Discussed the plan and events with the patient's husband at bedside. ? ? ? ?Acute respiratory failure with hypoxia requiring up to 15 L of oxygen ?Possibly secondary to IV Ativan/sedation ?Chest x-ray shows Stable mild cardiomegaly with suggestion of mild vascular congestion. Slight worsening hazy left base/retrocardiac opacification  likely effusion/atelectasis. ?Fluid management as per HD. ?Patient afebrile and WBC count within normal limits ? ?Acute metabolic encephalopathy probably secondary to seizures and postictal phase.  Patient minimally responsive to sternal rub. ? ?Mechanical fall ?X-ray showing  Transverse subcapital acute fracture of the right proximal femur with varus angulation. ?Pain control. ?Orthopedics consulted plan for surgical repair as per orthopedics.  She is scheduled for right posterior hemi hip arthroplasty today.  ?Cardiology consulted for pre op clearance.  ?Bedside echo showed moderate LV dysfunction . She is mildly increased risk given her LV dysfunction. ?Unable to participate in therapy as pt is post ictal. Will wait till she is alert enough to participate.  ? ?Essential hypertension ?Blood pressure parameters have improved.  ?Currently on many oral meds, unable to take them due to somnolence.  Blood pressure control with IV labetalol and IV hydralazine. ? ?Mechanical fall with CT of the head showing  Very small amount of subarachnoid blood over the right convexity. ?Neurosurgery consulted and recommended no further work-up.   ? ? ?End-stage renal disease on dialysis ?Nephrology on board  for HD, tths. ?Patient is scheduled for dialysis tomorrow ? ? ? ?Hyperlipidemia ?Continue with statin. ? ? ? ?Generalized anxiety disorder ?Oral medications on hold for somnolence ? ? ? ?  Dementia ?No behavioral abnormalities. ? ? ? ?Anemia of chronic disease ?Hemoglobin around 10.7. ?Stable.  ?Transfuse to keep hemoglobin greater than 7 ? ? ?Leukocytosis ?Resolved.  ?Unclear etiology. CT chest abdomen and pelvis do not show any infection.  ? ? 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 ?Recommend outpatient follow-up with vascular surgery.  ? ? ?Chronic combined CHF, with moderate pulmonary hypertension ?Pt appears compensated. Pt is on torsemide at home.  ?Echocardiogram  reviewed .  ? ? ?Elevated troponin:  ?From demand ischemia and ESRD.  ? ? ? ?Hypothyroidism: ?TSH wnl. Resume synthroid.  ?Interventions: Refer to RD note for recommendations ? ?Estimated body mass index is 21.28 kg/m? as calculated from the following: ?  Height as of this encounter: 5' 6"  (1.676 m). ?  Weight as of this encounter: 59.8 kg. ? ?Code Status: full code.  ?DVT Prophylaxis:  heparin injection 5,000 Units Start: 09/23/21 0930 ?SCDs Start: 09/22/21 1815 ?SCDs Start: 09/21/21 2321 ? ? ?Level of Care: Level of care: Progressive ?Family Communication: none at bedside.  ? ?Disposition Plan:     Remains inpatient appropriate: OR today.  ? ?Procedures:  ?None.  ? ?Consultants:   ?Orthopedics ?Cardiology.  ? ?Antimicrobials:  ? ?Anti-infectives (From admission, onward)  ? ? Start     Dose/Rate Route Frequency Ordered Stop  ? 09/23/21 0600  ceFAZolin (ANCEF) IVPB 2g/100 mL premix       ? 2 g ?200 mL/hr over 30 Minutes Intravenous On call to O.R. 09/22/21 1319 09/22/21 1540  ? 09/23/21 0500  ceFAZolin (ANCEF) IVPB 2g/100 mL premix  Status:  Discontinued       ? 2 g ?200 mL/hr over 30 Minutes Intravenous Every 12 hours 09/22/21 1814 09/22/21 1915  ? ?  ? ? ? ?Medications ? ?Scheduled Meds: ? acetaminophen  500 mg Oral Q6H  ? amLODipine  5 mg Oral Daily  ? atorvastatin  20 mg Oral QHS  ? chlorhexidine gluconate (MEDLINE KIT)  15 mL Mouth Rinse BID  ? Chlorhexidine Gluconate Cloth  6 each Topical Q0600  ? docusate sodium  100 mg Oral BID  ? feeding supplement (NEPRO CARB STEADY)  237 mL Oral BID BM  ? heparin injection (subcutaneous)  5,000 Units Subcutaneous Q8H  ? hydrALAZINE  100 mg Oral Q8H  ? labetalol  300 mg Oral BID  ? levothyroxine  75 mcg Oral QAC breakfast  ? LORazepam      ? LORazepam  2 mg Intravenous Once  ? mouth rinse  15 mL Mouth Rinse 10 times per day  ? multivitamin  1 tablet Oral QHS  ? senna  1 tablet Oral BID  ? sertraline  150 mg Oral Daily  ? sevelamer carbonate  2,400 mg Oral TID WC   ? ?Continuous Infusions: ? sodium chloride    ? sodium chloride    ? sodium chloride 75 mL/hr (09/23/21 0556)  ? levETIRAcetam    ? methocarbamol (ROBAXIN) IV    ? ?PRN Meds:.sodium chloride, sodium chloride, albuterol, alteplase, heparin, hydrALAZINE, HYDROcodone-acetaminophen, HYDROcodone-acetaminophen, labetalol, lidocaine (PF), lidocaine-prilocaine, menthol-cetylpyridinium **OR** phenol, methocarbamol **OR** methocarbamol (ROBAXIN) IV, metoCLOPramide **OR** metoCLOPramide (REGLAN) injection, morphine injection, ondansetron **OR** ondansetron (ZOFRAN) IV, pentafluoroprop-tetrafluoroeth, polyethylene glycol ? ? ? ?Subjective:  ? ?Katherine Ponce was seen and examined today.  Patient is somnolent and minimally responsive to sternal rub ? ?Objective:  ? ?Vitals:  ? 09/23/21 0745 09/23/21 1102 09/23/21 1109 09/23/21 1210  ?BP: (!) 184/78 Marland Kitchen)  175/73 (!) 175/73 134/64  ?Pulse:      ?Resp:   18 16  ?Temp:      ?TempSrc:      ?SpO2:   100%   ?Weight:      ?Height:      ? ? ?Intake/Output Summary (Last 24 hours) at 09/23/2021 1508 ?Last data filed at 09/22/2021 1653 ?Gross per 24 hour  ?Intake 500 ml  ?Output 100 ml  ?Net 400 ml  ? ?Filed Weights  ? 09/20/21 1816 09/22/21 0727 09/22/21 1130  ?Weight: 59 kg 59.5 kg 59.8 kg  ? ? ? ?Exam ?General exam: Ill-appearing lady not in any kind of distress on 15 L of oxygen via facemask ?Respiratory system: Diminished air entry at bases no wheezing heard ?Cardiovascular system: S1 & S2 heard, RRR. No JVD,  ?Gastrointestinal system: Abdomen is nondistended, soft and nontender. ?Central nervous system: Somnolent and minimally responsive to sternal rub ?Extremities: No pedal edema patient moving her left lower extremity spontaneously ?Skin: No rashes, lesions or ulcers ?Psychiatry: Unable to assess due to somnolence ? ? ?Data Reviewed:  I have personally reviewed following labs and imaging studies ? ? ?CBC ?Lab Results  ?Component Value Date  ? WBC 9.1 09/23/2021  ? RBC 3.05 (L) 09/23/2021   ? HGB 9.3 (L) 09/23/2021  ? HCT 29.7 (L) 09/23/2021  ? MCV 97.4 09/23/2021  ? MCH 30.5 09/23/2021  ? PLT 134 (L) 09/23/2021  ? MCHC 31.3 09/23/2021  ? RDW 14.6 09/23/2021  ? LYMPHSABS 0.4 (L) 09/20/2021  ? M

## 2021-09-23 NOTE — Progress Notes (Signed)
Patient had another episode of unresponsiveness after a seizure. On-call provider notified. VS WNL. Order received.See MAR. Pt is resting at this time. Will continue to monitor. ?

## 2021-09-23 NOTE — Progress Notes (Signed)
IVT code blue notes: ? Pt w/ 2 IV access.Pulse noted. Code team at bedside. ?

## 2021-09-23 NOTE — Progress Notes (Signed)
?   09/23/21 0409  ?Clinical Encounter Type  ?Visited With Health care provider;Patient not available  ?Visit Type Code;Initial ?(Code Blue)  ?Referral From Nurse  ?Consult/Referral To Chaplain  ? ?Paged to patient room for Code Blue. Upon arrival medical team was canceling page. Patient not seen by Chaplain. Per nursing staff, it was unknown if any family were present in hospital. [place], M. Min., [phone]. ?

## 2021-09-23 NOTE — Progress Notes (Signed)
EEG complete - results pending 

## 2021-09-23 NOTE — Consult Note (Signed)
Neurology Consultation ?Reason for Consult: seizure ?Referring Physician: Dr Hosie Poisson ? ?CC: seizure ? ?History is obtained from: chart review as patient altered ? ?HPI: Katherine Ponce is a 81 y.o. female with past medical history of end-stage renal disease on hemodialysis, hypertension, hyperlipidemia, hypothyroidism, dementia, history of brain aneurysm status postrepair, systolic congestive heart failure who presented on 09/20/2021 after a fall while trying to maneuver around the chair.  CT head at that time showed trace subarachnoid hemorrhage.  She was also noted to have right hip fracture status post arthroplasty on 09/22/2021. ? ?Per review of chart, at around 2 AM today, RN noted patient had an episode of "shaking of her whole body" and then unresponsiveness.  Rapid response was called.  On arrival, per rapid response nurse documentation, patient was arousable and localizing to noxious stimuli.  CBG was 111, blood pressure 141/69, respiratory rate 16 with 100% saturation on NRB at 15 L.  CODE BLUE was called again at around 4:20 AM for 2 episodes of unresponsiveness and generalized tonic-clonic seizure-like activity.  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  Patient was loaded with Keppra 1 g and started on 500 mg twice daily. ? ?Per chart review no prior h/o seizures.  ? ?ROS: Unable to obtain due to altered mental status.  ? ?Past Medical History:  ?Diagnosis Date  ? Anxiety   ? Carotid artery disease ([place])   ? Cerebral aneurysm   ? Chronic combined systolic and diastolic CHF (congestive heart failure) ([place])   ? COPD (chronic obstructive pulmonary disease) ([place])   ? Dementia ([place])   ? Depression   ? Emphysema of lung ([place])   ? Encephalomalacia   ? ESRD on dialysis [facility])   ? Hyperlipidemia   ? Hypertension   ? Hypothyroidism   ? Intracranial hemorrhage ([place])   ? Moderate pulmonary hypertension ([place])   ? Stroke [facility])   ? ? ?Family History  ?Problem Relation Age of Onset  ? CAD  Mother   ? ? ?Social History:  reports that she has been smoking cigarettes. She has never used smokeless tobacco. She reports that she does not currently use alcohol. No history on file for drug use. ? ? ?Medications Prior to Admission  ?Medication Sig Dispense Refill Last Dose  ? aspirin EC 81 MG tablet Take 81 mg by mouth at bedtime. Swallow whole.   09/19/2021  ? atorvastatin (LIPITOR) 20 MG tablet Take 20 mg by mouth at bedtime.   09/19/2021  ? hydrALAZINE (APRESOLINE) 100 MG tablet Take 100 mg by mouth 3 (three) times daily.   09/20/2021  ? labetalol (NORMODYNE) 300 MG tablet Take 300 mg by mouth 2 (two) times daily.   09/20/2021 at 0800  ? levothyroxine (SYNTHROID) 75 MCG tablet Take 75 mcg by mouth daily before breakfast.   09/20/2021  ? Multiple Vitamins-Minerals (ONE-A-DAY WOMENS 50+) TABS Take 1 tablet by mouth daily.   09/20/2021  ? NIFEdipine (PROCARDIA XL/NIFEDICAL XL) 60 MG 24 hr tablet Take 60 mg by mouth daily.   09/20/2021  ? sertraline (ZOLOFT) 100 MG tablet Take 150 mg by mouth daily.   09/20/2021  ? sevelamer carbonate (RENVELA) 800 MG tablet Take 2,400 mg by mouth in the morning and at bedtime.   09/20/2021  ? torsemide (DEMADEX) 20 MG tablet Take 20 mg by mouth 2 (two) times daily.   09/20/2021  ? vitamin B-12 (CYANOCOBALAMIN) 1000 MCG tablet Take 1,000 mcg by mouth daily.  09/20/2021  ?  ? ? ?Exam: ?Current vital signs: ?BP (!) 175/73 (BP Location: Left Arm)   Pulse 63   Temp 98.2 ?F (36.8 ?C) (Oral)   Resp 18   Ht 5\' 6"  (1.676 m)   Wt 59.8 kg   SpO2 100%   BMI 21.28 kg/m?  ?Vital signs in last 24 hours: ?Temp:  [97.1 ?F (36.2 ?C)-98.4 ?F (36.9 ?C)] 98.2 ?F (36.8 ?C) (04/07 0000) ?Pulse Rate:  [63-93] 63 (04/07 0446) ?Resp:  [13-18] 18 (04/07 1109) ?BP: (126-184)/(64-81) 175/73 (04/07 1109) ?SpO2:  [93 %-100 %] 100 % (04/07 1109) ?FiO2 (%):  [32 %] 32 % (04/06 1815) ? ? ?Physical Exam  ?Constitutional: Appears well-developed and well-nourished.  ?Neuro: grimaces to tactile stimuli, doesn't open eyes,  follows simple commands like raising arm, wiggling toes, PERLA, no gaze deviation, antigravity strength in LUE/LLE, withdraws in RUE/RLE ? ? ?I have reviewed labs in epic and the results pertinent to this consultation are: ?CBC:  ?Recent Labs  ?Lab 09/20/21 ?2307 09/21/21 ?2131 09/22/21 ?1854 09/23/21 ?0134  ?WBC 15.2*   < > 12.1* 9.1  ?NEUTROABS 13.9*  --   --   --   ?HGB 10.7*   < > 9.7* 9.3*  ?HCT 33.2*   < > 30.9* 29.7*  ?MCV 97.6   < > 97.5 97.4  ?PLT 174   < > 142* 134*  ? < > = values in this interval not displayed.  ? ? ?Basic Metabolic Panel:  ?Lab Results  ?Component Value Date  ? NA 138 09/23/2021  ? K 4.3 09/23/2021  ? CO2 25 09/23/2021  ? GLUCOSE 86 09/23/2021  ? BUN 24 (H) 09/23/2021  ? CREATININE 3.77 (H) 09/23/2021  ? CALCIUM 8.6 (L) 09/23/2021  ? GFRNONAA 12 (L) 09/23/2021  ? ?Lipid Panel: No results found for: [place] ?HgbA1c: No results found for: HGBA1C ?Urine Drug Screen: No results found for: LABOPIA, COCAINSCRNUR, [place], [place], THCU, LABBARB  ?Alcohol Level  ?   ?Component Value Date/Time  ? ETH <10 09/20/2021 1809  ? ? ? ?I have reviewed the images obtained: ? ?CT head without contrast 09/23/2021: No acute intracranial abnormality. ?Old left MCA territory infarct and findings of chronic microvascular ischemia. Remote left craniotomy. Multiple vascular clips at the skull base ? ?ASSESSMENT/PLAN: 81yo F with left craniotomy for aneurysmal clipping now POD1 s/p right hip arthroplasty now with 3 seizures ? ?New onset epilepsy ?Prior aneurysmal clipping ?Acute encephalopathy ?- Seizure likely due to prior aneurysm ?- encephalopathy likely due to post ictal state, anti seizure meds ? ?Recommendations: ?- will discuss with casa manager if vimpat is covered by insurance to minimize sedation and because keppra is renally excreted and patient is on HD so will need adjustment in dosing  ?- continue keppra 5000mg  BID in the meantine, renally adjusted ?- routine eeg didn't show any seizures. However,  if patient remains encephalopathic by tomorrow, will obtain vEEG to look for intermittent subclinical seizures ?- continue seizure precautions ?-PRN IV ativan for clinical sz ?- management of rest of comorbidity per primary team ?- of note I attempted to call husband 3 times at number listed in epic. Call was picked up each time but immediately disconnected without opportunity to leave voice mail.  ? ?Thank you for allowing [place] to participate in the care of this patient. If you have any further questions, please contact  me or neurohospitalist.  ? ?Zeb Comfort ?Epilepsy ?[facility] neuro[facility]ist ? ? ? ? ?

## 2021-09-23 NOTE — Progress Notes (Signed)
OT Cancellation Note ? ?Patient Details ?Name: Katherine Ponce ?MRN: [id number] ?DOB: [birth year] ? ? ?Cancelled Treatment:    Reason Eval/Treat Not Completed: Patient not medically ready.  Per PT conversation with MD: "Spoke with Dr. Karleen Hampshire who request holding PT/OT Evaluations until Monday 09/26/21."  OT will continue efforts as appropriate.  . ? ?Doria Fern D Johnella Crumm ?09/23/2021, 9:58 AM ?

## 2021-09-23 NOTE — Progress Notes (Signed)
?[facility] ?Progress Note  ? ?Assessment/ Plan:   ?Assessment/Plan: ?1 R hip fracture: 2/2 mechanical fall.  s/p R hemiarthroplasty with ortho yesterday ?2. Seizure activity: had some activity last night, repeat CT head without new stroke/ worsening of SAH.  Got Ativan and Keppra.  Neurology following ?3.  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  NO surgical intervention recommended  ?4ESRD: TTS with Dr Johny Blamer HD 09/22/21, next HD 09/24/21- gentle UF goal  ?5 Hypertension: labetalol and hydralazine IV, not taking PO today.   ?6. Anemia of ESRD: Hgb 10.2, no ESA needed ?7. Metabolic Bone Disease: Renvela as binder, Phos 4.1 ?8  Dispo: admitted ? ?Subjective:   ? ?S/p hip arthroplasty yesterday.  Had some seizure activity last night, got Ativan, Keppra, now pretty sleepy. Getting IV hydralazine and labetalol as she can't take her pills this AM.    ? ?Objective:   ?BP (!) 175/73 (BP Location: Left Arm)   Pulse 63   Temp 98.2 ?F (36.8 ?C) (Oral)   Resp 18   Ht 5' 6"  (1.676 m)   Wt 59.8 kg   SpO2 100%   BMI 21.28 kg/m?  ? ?Physical Exam: ?GEN lying in bed, sleepy ?HEENT bilateral orbit ecchymoses, R forehead lac ?NECK no JVD ?PULM clear anteriorly ?CV RRR ?ABD; soft ?EXT no LE edema, R leg in immobilizer ?NEURO somnolent ?ACCESS: R AVF + T/B ? ?Labs: ?BMET ?Recent Labs  ?Lab 09/20/21 ?1809 09/20/21 ?1817 09/20/21 ?2307 09/21/21 ?2131 09/22/21 ?0145 09/22/21 ?1457 09/22/21 ?1854 09/23/21 ?0134  ?NA 140 140  --  141 140 138  --  138  ?K 4.9 4.8  --  4.8 4.8 3.7  --  4.3  ?CL 103 102  --  104 106 98  --  101  ?CO2 26  --   --  27 25  --   --  25  ?GLUCOSE 93 95  --  85 77 71  --  86  ?BUN 17 22  --  31* 34* 15  --  24*  ?CREATININE 3.84* 3.80*  --  5.86* 6.16* 2.90* 3.31* 3.77*  ?CALCIUM 9.0  --   --  9.1 9.1  --   --  8.6*  ?PHOS  --   --  4.1 6.5*  --   --   --   --   ? ?CBC ?Recent Labs  ?Lab 09/20/21 ?2307 09/21/21 ?2131 09/22/21 ?0145 09/22/21 ?1457 09/22/21 ?1854 09/23/21 ?0134  ?WBC 15.2* 10.7*  9.8  --  12.1* 9.1  ?NEUTROABS 13.9*  --   --   --   --   --   ?HGB 10.7* 10.6* 10.5* 10.5* 9.7* 9.3*  ?HCT 33.2* 33.0* 32.9* 31.0* 30.9* 29.7*  ?MCV 97.6 98.5 96.8  --  97.5 97.4  ?PLT 174 168 170  --  142* 134*  ? ? ?  ?Medications:   ? ? acetaminophen  500 mg Oral Q6H  ? amLODipine  5 mg Oral Daily  ? atorvastatin  20 mg Oral QHS  ? chlorhexidine gluconate (MEDLINE KIT)  15 mL Mouth Rinse BID  ? Chlorhexidine Gluconate Cloth  6 each Topical Q0600  ? docusate sodium  100 mg Oral BID  ? feeding supplement (NEPRO CARB STEADY)  237 mL Oral BID BM  ? heparin injection (subcutaneous)  5,000 Units Subcutaneous Q8H  ? hydrALAZINE  100 mg Oral Q8H  ? labetalol  300 mg Oral BID  ? levothyroxine  75 mcg Oral QAC breakfast  ?  LORazepam      ? LORazepam  2 mg Intravenous Once  ? mouth rinse  15 mL Mouth Rinse 10 times per day  ? multivitamin  1 tablet Oral QHS  ? senna  1 tablet Oral BID  ? sertraline  150 mg Oral Daily  ? sevelamer carbonate  2,400 mg Oral TID WC  ? ? ? ?Madelon Lips MD ?09/23/2021, 12:00 PM   ?

## 2021-09-24 ENCOUNTER — Inpatient Hospital Stay (HOSPITAL_COMMUNITY): Payer: Medicare Other

## 2021-09-24 DIAGNOSIS — Z515 Encounter for palliative care: Secondary | ICD-10-CM

## 2021-09-24 DIAGNOSIS — Z7189 Other specified counseling: Secondary | ICD-10-CM

## 2021-09-24 DIAGNOSIS — E782 Mixed hyperlipidemia: Secondary | ICD-10-CM | POA: Diagnosis not present

## 2021-09-24 DIAGNOSIS — R569 Unspecified convulsions: Secondary | ICD-10-CM | POA: Diagnosis not present

## 2021-09-24 DIAGNOSIS — I5022 Chronic systolic (congestive) heart failure: Secondary | ICD-10-CM | POA: Diagnosis not present

## 2021-09-24 DIAGNOSIS — S72001D Fracture of unspecified part of neck of right femur, subsequent encounter for closed fracture with routine healing: Secondary | ICD-10-CM | POA: Diagnosis not present

## 2021-09-24 DIAGNOSIS — I10 Essential (primary) hypertension: Secondary | ICD-10-CM | POA: Diagnosis not present

## 2021-09-24 LAB — BASIC METABOLIC PANEL
Anion gap: 11 (ref 5–15)
BUN: 37 mg/dL — ABNORMAL HIGH (ref 8–23)
CO2: 25 mmol/L (ref 22–32)
Calcium: 8.5 mg/dL — ABNORMAL LOW (ref 8.9–10.3)
Chloride: 103 mmol/L (ref 98–111)
Creatinine, Ser: 5.44 mg/dL — ABNORMAL HIGH (ref 0.44–1.00)
GFR, Estimated: 8 mL/min — ABNORMAL LOW (ref >60)
Glucose, Bld: 81 mg/dL (ref 70–99)
Potassium: 3.9 mmol/L (ref 3.5–5.1)
Sodium: 139 mmol/L (ref 135–145)

## 2021-09-24 LAB — CBC
HCT: 27.3 % — ABNORMAL LOW (ref 36.0–46.0)
Hemoglobin: 8.8 g/dL — ABNORMAL LOW (ref 12.0–15.0)
MCH: 30.9 pg (ref 26.0–34.0)
MCHC: 32.2 g/dL (ref 30.0–36.0)
MCV: 95.8 fL (ref 80.0–100.0)
Platelets: 146 10*3/uL — ABNORMAL LOW (ref 150–400)
RBC: 2.85 MIL/uL — ABNORMAL LOW (ref 3.87–5.11)
RDW: 14.3 % (ref 11.5–15.5)
WBC: 7.1 10*3/uL (ref 4.0–10.5)
nRBC: 0 % (ref 0.0–0.2)

## 2021-09-24 LAB — AMMONIA: Ammonia: 27 umol/L (ref 9–35)

## 2021-09-24 LAB — VITAMIN B12: Vitamin B-12: 2284 pg/mL — ABNORMAL HIGH (ref 180–914)

## 2021-09-24 LAB — FOLATE: Folate: 18.1 ng/mL (ref >5.9)

## 2021-09-24 MED ORDER — LEVETIRACETAM IN NACL 500 MG/100ML IV SOLN
500.0000 mg | Freq: Once | INTRAVENOUS | Status: AC
  Administered 2021-09-24: 500 mg via INTRAVENOUS
  Filled 2021-09-24: qty 100

## 2021-09-24 MED ORDER — DARBEPOETIN ALFA 100 MCG/0.5ML IJ SOSY: 100.0000 ug | PREFILLED_SYRINGE | INTRAMUSCULAR | Status: DC

## 2021-09-24 MED ORDER — LEVETIRACETAM IN NACL 1000 MG/100ML IV SOLN
1000.0000 mg | INTRAVENOUS | Status: DC
Start: 2021-09-25 — End: 2021-09-26
  Administered 2021-09-25 – 2021-09-26 (×2): 1000 mg via INTRAVENOUS
  Filled 2021-09-24 (×2): qty 100

## 2021-09-24 MED ORDER — LEVETIRACETAM IN NACL 500 MG/100ML IV SOLN
500.0000 mg | INTRAVENOUS | Status: DC
Start: 2021-09-27 — End: 2021-09-26

## 2021-09-24 MED ORDER — DARBEPOETIN ALFA 100 MCG/0.5ML IJ SOSY
100.0000 ug | PREFILLED_SYRINGE | INTRAMUSCULAR | Status: DC
Start: 2021-09-24 — End: 2021-09-28

## 2021-09-24 NOTE — Progress Notes (Signed)
Neurology Progress Note ? ? ?S:// ?Saw patient in HD suite. Patient is awake and alert. Confused, can only correctly tell me her name.  ? ? ?O:// ?Current vital signs: ?BP (!) 157/71   Pulse 73   Temp 98 ?F (36.7 ?C) (Temporal)   Resp 18   Ht 5\' 6"  (1.676 m)   Wt 59.8 kg   SpO2 100%   BMI 21.28 kg/m?  ?Vital signs in last 24 hours: ?Temp:  [98 ?F (36.7 ?C)-98.8 ?F (37.1 ?C)] 98 ?F (36.7 ?C) (04/08 0800) ?Pulse Rate:  [71-78] 73 (04/08 0900) ?Resp:  [16-23] 18 (04/08 0900) ?BP: (134-176)/(60-77) 157/71 (04/08 0900) ?SpO2:  [94 %-100 %] 100 % (04/08 0900) ? ?GENERAL: Awake, alert in NAD ?HEENT: - Normocephalic and atraumatic, dry mm. Bruising on neck and right side of face ?LUNGS - Clear to auscultation bilaterally with no wheezes ?CV - S1S2 RRR, no m/r/g, equal pulses bilaterally. ?ABDOMEN - Soft, nontender, nondistended with normoactive BS ?Ext: warm, well perfused, intact peripheral pulses, +1 edema right upper arm. AV graft in right arm  ? ?NEURO:  ?Mental Status: Awake alert and oriented to self. Follows commands ?Speech: clear ?Visual: full to finger counting ?Pupils: equal and reactive ?EOM: intact  ?Corneal reflex: not tested in this awake patient ?Face: symmetric ?Cough/gag: not tested in this awake patient ?Motor: Right upper not tested due to AV graft in this arm and connected to HD, good grip strength, Left upper 5/5, left lower 4/5, right lower withdraws to pain, hurts to move ?Tone: is normal and bulk is normal ?Sensation- Intact to light touch bilaterally ?Coordination: FTN intact on left, unable to assess right side  ?Gait- deferred ? ? ?Imaging ?I have reviewed images in epic and the results pertinent to this consultation are: ? ?CT-scan of the brain 4/7 ?1. No acute intracranial abnormality. ?2. 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 ? ?rEEG 4/7: ?This study is suggestive of cortical dysfunction arising from left frontal region consistent with prior  craniotomy. Additionally there is moderate diffuse encephalopathy, nonspecific etiology. No seizures or definite epileptiform discharges were seen throughout the recording. ?  ?Assessment: 81 y.o. female with past medical history of end-stage renal disease on hemodialysis, hypertension, hyperlipidemia, hypothyroidism, dementia, history of brain aneurysm status-post repair, systolic congestive heart failure who presented on 09/20/2021 after a fall while trying to maneuver around the chair.  CT head at that time showed trace subarachnoid hemorrhage.  She was also noted to have right hip fracture and is status post arthroplasty on 09/22/2021. ?The morning of 4/7 patient had an episode of "shaking of her whole body" and then unresponsiveness.  Rapid response was called.  On arrival, per rapid response nurse documentation, patient was arousable and localizing to noxious stimuli. A few hours later a code blue was called for 2 episodes of unresponsiveness and generalized tonic-clonic seizure-like activity.  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  Patient was loaded with Keppra 1 g and started on 500 mg twice daily ?- DDx: New onset seizures likely due to old left MCA territory infarction versus chronic sequelae of prior aneurysm repair ?- EEG performed yesterday (Friday): Continuous slow, generalized and maximal left frontal region. Breach artifact, left frontal region. This study is suggestive of cortical dysfunction arising from the left frontal region consistent with prior craniotomy. Additionally there is moderate diffuse encephalopathy, nonspecific to etiology. No seizures or definite epileptiform discharges were seen throughout the recording. ?-  Acute encephalopathy, most likely due to post ictal state, anti seizure meds ?- CT head: Old left MCA territory infarct with ex vacuo dilatation of the frontal horn of the left lateral ventricle. Multiple vascular clips are seen at the skull  base. Remote left craniotomy also noted. ? ? ?Recommendations: ?- Seizure precautions  ?- Given that she is on hemodialysis, Keppra will need to be changed to the following dosing schedule: 1000 mg po qd with supplemental dose of 500 mg after dialysis sessions.   ?- Given that the patient remains encephalopathic, will obtain vEEG to look for intermittent subclinical seizures  ?- PRN IV Ativan for clinical sz ?- Management of rest of comorbidities per primary team ? ?Beulah Gandy DNP, ACNPC-AG  ? ?Electronically signed: Dr. Kerney Elbe ? ?  ?

## 2021-09-24 NOTE — Evaluation (Signed)
Clinical/Bedside Swallow Evaluation ?Patient Details  ?Name: Katherine Ponce ?MRN: [id number] ?Date of Birth: 1941-03-02 ? ?Today's Date: 09/24/2021 ?Time: SLP Start Time (ACUTE ONLY): 3419 SLP Stop Time (ACUTE ONLY): 3790 ?SLP Time Calculation (min) (ACUTE ONLY): 15 min ? ?Past Medical History:  ?Past Medical History:  ?Diagnosis Date  ? Anxiety   ? Carotid artery disease ([place])   ? Cerebral aneurysm   ? Chronic combined systolic and diastolic CHF (congestive heart failure) ([place])   ? COPD (chronic obstructive pulmonary disease) ([place])   ? Dementia ([place])   ? Depression   ? Emphysema of lung ([place])   ? Encephalomalacia   ? ESRD on dialysis [facility])   ? Hyperlipidemia   ? Hypertension   ? Hypothyroidism   ? Intracranial hemorrhage ([place])   ? Moderate pulmonary hypertension ([place])   ? Stroke [facility])   ? ?Past Surgical History:  ?Past Surgical History:  ?Procedure Laterality Date  ? CEREBRAL ANEURYSM REPAIR    ? ?HPI:  ?Patient is a 81 y.o. female with PMH: chronic systolic heart failure, renal disease on dialysis, HTN, HLD, hypothryoidism, generalized anxiety disorder, dementia, h/o brain aneurysm mild repair.  She presented to ED after mechanical fall. She was found to have right sided femur fracture and underwent surgical repair. She had two episodes of post-op unreponsiveness followed by generalized tonic clonic seizures lasting less than a minute. CT  Head showed old strokes but no acute strokes were seen, unable to do MRI because of h/o brain aneurysm repair with metal clippings. CXR showed slight worsening hazy left base/retrocardiac opacification likely effusion/atelectasis. Patient has had lethargy but this has been improving.  ?  ?Assessment / Plan / Recommendation  ?Clinical Impression ? Patient presents with what appears to be a normal pharyngeal phase of swallow but oral phase impacted by lack of dentition. Patient was observed while consuming thin liquids via straw sips and saltine crackers. She has bottom dentures but  does not have any top dentures. She reported that she does not have top dentures but that she needs them to chew. She is confused and no family present and so she was not an accurate historian. Only difficulty SLP observed was delayed and inefficient mastication of cracker suspected due to lack of top dentition/dentures. SLP is recommending change patient's diet consistency to Dys 3 solids (mechanical soft). No f/u SLP intervention recommended at this time. ?SLP Visit Diagnosis: Dysphagia, unspecified (R13.10) ?   ?Aspiration Risk ? No limitations;Mild aspiration risk  ?  ?Diet Recommendation Dysphagia 3 (Mech soft);Thin liquid  ? ?Liquid Administration via: Cup;Straw ?Medication Administration: Whole meds with liquid ?Supervision: Patient able to self feed ?Compensations: Minimize environmental distractions;Slow rate;Small sips/bites ?Postural Changes: Seated upright at 90 degrees  ?  ?Other  Recommendations Oral Care Recommendations: Oral care BID   ? ?Recommendations for follow up therapy are one component of a multi-disciplinary discharge planning process, led by the attending physician.  Recommendations may be updated based on patient status, additional functional criteria and insurance authorization. ? ?Follow up Recommendations No SLP follow up  ? ? ?  ?Assistance Recommended at Discharge Set up Supervision/Assistance  ?Functional Status Assessment Patient has had a recent decline in their functional status and demonstrates the ability to make significant improvements in function in a reasonable and predictable amount of time.  ?Frequency and Duration   N/A ?  ?  ?   ? ?Prognosis   N/A ? ?  ? ?Swallow Study   ?General Date of Onset: 09/23/21 ?HPI:  Patient is a 81 y.o. female with PMH: chronic systolic heart failure, renal disease on dialysis, HTN, HLD, hypothryoidism, generalized anxiety disorder, dementia, h/o brain aneurysm mild repair.  She presented to ED after mechanical fall. She was found to have right  sided femur fracture and underwent surgical repair. She had two episodes of post-op unreponsiveness followed by generalized tonic clonic seizures lasting less than a minute. CT  Head showed old strokes but no acute strokes were seen, unable to do MRI because of h/o brain aneurysm repair with metal clippings. CXR showed slight worsening hazy left base/retrocardiac opacification likely effusion/atelectasis. Patient has had lethargy but this has been improving. ?Type of Study: Bedside Swallow Evaluation ?Previous Swallow Assessment: none found ?Diet Prior to this Study: Regular;Thin liquids ?Respiratory Status: Room air ?History of Recent Intubation: No ?Behavior/Cognition: Pleasant mood;Alert;Cooperative;Confused ?Oral Cavity Assessment: Within Functional Limits ?Oral Care Completed by SLP: No ?Oral Cavity - Dentition: Dentures, bottom;Edentulous;Other (Comment) (top dentures not present) ?Self-Feeding Abilities: Able to feed self;Needs set up;Needs assist ?Patient Positioning: Upright in bed ?Baseline Vocal Quality: Normal ?Volitional Cough: Cognitively unable to elicit ?Volitional Swallow: Unable to elicit  ?  ?Oral/Motor/Sensory Function Overall Oral Motor/Sensory Function: Within functional limits   ?Ice Chips     ?Thin Liquid Thin Liquid: Within functional limits ?Presentation: Straw;Self Fed  ?  ?Nectar Thick     ?Honey Thick     ?Puree Puree: Not tested   ?Solid ? ? ?  Solid: Impaired ?Oral Phase Impairments: Impaired mastication ?Oral Phase Functional Implications: Impaired mastication  ? ?  ?Sonia Baller, MA, CCC-SLP ?Speech Therapy ? ? ?

## 2021-09-24 NOTE — Progress Notes (Signed)
LTM placed, no skin breakdown, atrium monitoring-test button  ?

## 2021-09-24 NOTE — Progress Notes (Signed)
? ? ? [facility] ?                                                                            ? ? ?Katherine Ponce, is a 81 y.o. female, DOB - 05/30/1941, TGG:[id number] ?Admit date - 09/20/2021    ?Outpatient Primary MD for the patient is Kristopher Glee., MD ? ?LOS - 4  days ? ? ? ?Brief summary  ? 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 She was found to have a subcapital acute fracture of the right proximal femur, underwent surgical repair. Post op, around 4 am pt had two episodes of unresponsiveness followed by generalized tonic clonic seizures lasting less than a minute. EEG ordered and neurology consulted for recommendations. She was given a dose of IV ativan and IV keppra loading dose followed by BID IV keppra.  ?Pt seen and examined today.  ? ? ?Assessment & Plan  ? ? ?Assessment and Plan: ? ? ? ?Generalized tonic-clonic seizures followed by postictal somnolence. ?Patient received IV Ativan and IV Keppra 1 g followed by Keppra 500 mg twice daily. ?EEG was done shows continuous slow, generalized from the left frontal area where she had prior craniotomy.  She also has moderate diffuse encephalopathy of nonspecific etiology. ?Neurology consulted will wait for recommendations. ?CT of the head shows old strokes, no acute strokes seen. ?Unable to do MRI of the brain without contrast as patient has a history of brain aneurysm repair with metal clippings from [place]. ?Pt 's lethargy improved, but remains confused and plan for LTM EEG.  ? ? ? ?Acute respiratory failure with hypoxia  ?Much improved, weaned oxygen to 3 lit of [place] oxygen.  ?Possibly secondary to IV Ativan/sedation ?Chest x-ray shows Stable mild cardiomegaly with suggestion of mild vascular congestion.  Slight worsening hazy left base/retrocardiac opacification likely effusion/atelectasis. ?Fluid management as per HD. ?Patient afebrile and WBC count within normal limits ? ?Acute metabolic encephalopathy probably secondary to seizures and postictal phase.   ?Pt still lethargic, but wakes up to say her name.  ? ? ?Mechanical fall ?X-ray showing  Transverse subcapital acute fracture of the right proximal femur with varus angulation. ?Pain control. ?Orthopedics consulted plan for surgical repair as per orthopedics.  She is scheduled for right posterior hemi hip arthroplasty today.  ?Cardiology consulted for pre op clearance.  ?Bedside echo showed moderate LV dysfunction . She is mildly increased risk given her LV dysfunction. ?Will request PT to come in am.  ? ?Essential hypertension ?Blood pressure parameters are better controlled today.  ?Currently on many oral meds, continue the same.  ? ?Mechanical fall with CT of the head showing  Very small amount of subarachnoid blood over the right convexity. ?Neurosurgery consulted and recommended no further work-up.   ? ? ?End-stage renal disease on dialysis ?Nephrology on board  for HD, tths. ?Appreciate recommendations.  ? ? ? ?Hyperlipidemia ?Continue with statin. ? ? ? ?Generalized anxiety disorder ?Oral medications on hold for somnolence ? ? ? ?Dementia ?No behavioral abnormalities. ? ? ? ?  Anemia of chronic disease ?Hemoglobin around 10.7. ?Stable.  ?Transfuse to keep hemoglobin greater than 7 ? ? ?Leukocytosis ?Resolved.  ?Unclear etiology. CT chest abdomen and pelvis do not show any infection.  ? ? 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 ?Recommend outpatient follow-up with vascular surgery.  ? ? ?Chronic combined CHF, with moderate pulmonary hypertension ?Pt appears compensated. Pt is on torsemide at home which is on hold for now.   ?Echocardiogram reviewed .  ? ? ?Elevated troponin:  ?From demand ischemia  and ESRD.  ? ? ? ?Hypothyroidism: ?TSH wnl. Resume synthroid.  ?Interventions: Refer to RD note for recommendations ? ?Estimated body mass index is 21.28 kg/m? as calculated from the following: ?  Height as of this encounter: 5' 6" (1.676 m). ?  Weight as of this encounter: 59.8 kg. ? ?Code Status: full code.  ?DVT Prophylaxis:  heparin injection 5,000 Units Start: 09/23/21 0930 ?SCDs Start: 09/22/21 1815 ?SCDs Start: 09/21/21 2321 ? ? ?Level of Care: Level of care: Progressive ?Family Communication: none at bedside.  ? ?Disposition Plan:     Remains inpatient appropriate: LTM EEG ? ?Procedures:  ?None.  ? ?Consultants:   ?Orthopedics ?Cardiology.  ? ?Antimicrobials:  ? ?Anti-infectives (From admission, onward)  ? ? Start     Dose/Rate Route Frequency Ordered Stop  ? 09/23/21 0600  ceFAZolin (ANCEF) IVPB 2g/100 mL premix       ? 2 g ?200 mL/hr over 30 Minutes Intravenous On call to O.R. 09/22/21 1319 09/22/21 1540  ? 09/23/21 0500  ceFAZolin (ANCEF) IVPB 2g/100 mL premix  Status:  Discontinued       ? 2 g ?200 mL/hr over 30 Minutes Intravenous Every 12 hours 09/22/21 1814 09/22/21 1915  ? ?  ? ? ? ?Medications ? ?Scheduled Meds: ? amLODipine  5 mg Oral Daily  ? atorvastatin  20 mg Oral QHS  ? chlorhexidine gluconate (MEDLINE KIT)  15 mL Mouth Rinse BID  ? Chlorhexidine Gluconate Cloth  6 each Topical Q0600  ? darbepoetin (ARANESP) injection - DIALYSIS  100 mcg Intravenous Q Sat-HD  ? docusate sodium  100 mg Oral BID  ? feeding supplement (NEPRO CARB STEADY)  237 mL Oral BID BM  ? heparin injection (subcutaneous)  5,000 Units Subcutaneous Q8H  ? hydrALAZINE  100 mg Oral Q8H  ? labetalol  300 mg Oral BID  ? levothyroxine  75 mcg Oral QAC breakfast  ? LORazepam  2 mg Intravenous Once  ? mouth rinse  15 mL Mouth Rinse 10 times per day  ? multivitamin  1 tablet Oral QHS  ? senna  1 tablet Oral BID  ? sertraline  150 mg Oral Daily  ? sevelamer carbonate  2,400 mg Oral TID WC  ? ?Continuous Infusions: ? sodium chloride     ? sodium chloride    ? sodium chloride 5 mL/hr (09/24/21 5701)  ? [START ON 09/25/2021] levETIRAcetam    ? levETIRAcetam    ? [START ON 09/27/2021] levETIRAcetam    ? methocarbamol (ROBAXIN) IV    ? ?PRN Meds:.sodium chloride, sodium chloride, acetaminophen, albuterol, alteplase, heparin, hydrALAZINE, HYDROcodone-acetaminophen, HYDROcodone-acetaminophen, labetalol, lidocaine (PF), lidocaine-prilocaine, menthol-cetylpyridinium **OR** phenol, methocarbamol **OR** methocarbamol (ROBAXIN) IV, metoCLOPramide **OR** metoCLOPramide (REGLAN) injection, morphine injection, ondansetron **OR** ondansetron (ZOFRAN) IV, pentafluoroprop-tetrafluoroeth, polyethylene glycol ? ? ? ?Subjective:  ? ?Katherine Ponce was seen and examined today.  PT opening eyes with verbal cues. Not in distress.  ? ?Objective:  ? ?Vitals:  ?  09/24/21 1145 09/24/21 1200 09/24/21 1235 09/24/21 1505  ?BP: (!) 164/68 (!) 168/68 (!) 182/83 (!) 147/64  ?Pulse: 76 78 86 74  ?Resp: (!) 21 (!) _0 ?Temp: 97.7 ?F (36.5 ?C) 97.7 ?F (36.5 ?C) 98.3 ?F (36.8 ?C)   ?TempSrc:   Oral   ?SpO2: 99% 99% 98% 96%  ?Weight:      ?Height:      ? ? ?Intake/Output Summary (Last 24 hours) at 09/24/2021 1603 ?Last data filed at 09/24/2021 1500 ?Gross per 24 hour  ?Intake 621.29 ml  ?Output 1500 ml  ?Net -878.71 ml  ? ? ?Filed Weights  ? 09/22/21 0727 09/22/21 1130  ?Weight: 59.5 kg 59.8 kg  ? ? ? ?Exam ? ?General exam: ill appearing lady not in distress.  ?Respiratory system: Clear to auscultation. Respiratory effort normal. ?Cardiovascular system: S1 & S2 heard, RRR. No JVD, No pedal edema. ?Gastrointestinal system: Abdomen is nondistended, soft and nontender.  Normal bowel sounds heard. ?Central nervous system: Arousable, and oriented to person only. No focal deficits.  ?Extremities: no pedal edema.  ?Skin: No rashes, lesions or ulcers ?Psychiatry: Mood & affect appropriate.  ? ? ?Data Reviewed:  I have personally reviewed following labs and imaging studies ? ? ?CBC ?Lab Results   ?Component Value Date  ? WBC 7.1 09/24/2021  ? RBC 2.85 (L) 09/24/2021  ? HGB 8.8 (L) 09/24/2021  ? HCT 27.3 (L) 09/24/2021  ? MCV 95.8 09/24/2021  ? MCH 30.9 09/24/2021  ? PLT 146 (L) 09/24/2021  ? MCHC

## 2021-09-24 NOTE — Progress Notes (Signed)
ORTHOPAEDIC PROGRESS NOTE ? ?s/p Procedure(s): ?RIGHT POSTERIOR APPROACH HEMI HIP ARTHROPLASTY on 4/6 ? ?SUBJECTIVE: ?Patient more awake this morning. Resting with her hip flexed. Pain with attempts to extend the leg.  ? ?OBJECTIVE: ?PE: ?RLE: dressing CDI. Resting with hip flexed. Grimaces with attempts to extend the leg. appreciate a slight flicker of movement in her toes. Warm well perfused foot ? ?Vitals:  ? 09/23/21 2027 09/23/21 2152  ?BP:  (!) 160/65  ?Pulse:  78  ?Resp:  16  ?Temp: 98.8 ?F (37.1 ?C) 98.8 ?F (37.1 ?C)  ?SpO2:  98%  ? ? ? ?ASSESSMENT: ?Akayla Brass is a 81 y.o. female POD#2 with new onset seizure ? ?PLAN: ?Weightbearing: WBAT ?Insicional and dressing care: Reinforce dressings as needed ?Orthopedic device(s): None ?Showering:Hold for now ?VTE prophylaxis: Heparin per primary team ?Pain control: PRN pain medications, minimize as able ?Follow - up plan: 2 weeks in office ?Contact information:  After hours and holidays please check [email or web address] for group call information for [facility] ? 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 Will order post-op films as patient rests with her hip flexed and has pain with attempts to straighten her leg.  ? ?Noemi Chapel, PA-C ?09/24/21 ? ? ?

## 2021-09-24 NOTE — Progress Notes (Signed)
Patient Aox2-3 throughout the day. Since EEG monitor placed this evening, patient has been more confused. Aox1. Has talked to husband already but keeps wanting to call him back. Pulling at wires and devices. TELE monitoring in placed, not successful with patient. 1 to 1 sitter at bedside. Patient more cooperative with sitter at bedside and is able to be more directed easily with staff present in room. ?

## 2021-09-24 NOTE — Progress Notes (Signed)
Post-op xrays demonstrate a stable right hip arthroplasty without any complicating features. Patient to work with PT/OT hopefully today after her dialysis ? ?Noemi Chapel, PA-C ?09/24/21 ?

## 2021-09-24 NOTE — Consult Note (Signed)
?Palliative Medicine Inpatient Consult Note ? ?Consulting Provider: Hosie Poisson, MD ? ?Reason for consult:   ?[place] Palliative Medicine Consult  ?Reason for Consult? Goals of care. sick patient.  ? 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 She was found to have a subcapital acute fracture of the right proximal femur, underwent surgical repair. Post op, around 4 am pt had two episodes of unresponsiveness followed by generalized tonic clonic seizures lasting less than a minute. EEG ordered and neurology consulted for recommendations. She was given a dose of IV ativan and IV keppra loading dose followed by BID IV keppra. Pt seen and examined today. ? ?Palliative care has been asked to get involved to further address goals of care.  ? ?Clinical Assessment/Goals of Care: ? ?*Please note that this is a verbal dictation therefore any spelling or grammatical errors are due to the "[facility]" system interpretation. ? ?I have reviewed medical records including EPIC notes, labs and imaging, received report from bedside RN, assessed the patient.  ?  ?I met with Alayjah, Boehringer (spouse), and Hilda Blades (daughter) to further discuss diagnosis prognosis, [place], EOL wishes, disposition and options. ?  ?I introduced Palliative Medicine as specialized medical care for people living with serious illness. It focuses on providing relief from the symptoms and stress of a serious illness. The goal is to improve quality of life for both the patient and the family. ? ?Medical History Review and Understanding: ? ?Reviewed Thanvi's history of heart failure renal disease on dialysis on Tuesday Thursday and Saturday, hypertension, hyperlipidemia,  hypothyroidism generalized anxiety disorder, dementia,CVA's,  history of brain aneurysm mild repair 40 years ago.  ? ?Social History: ? ?Inika is originally from [place]. She moved to [place] 20 years ago. She has been married to her husband, Delfino Lovett for 60 years. They share one son and one daughter. They also have one granddaughter. 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 She use to enjoy gardening. She has no overt faithful believes that need to be honored. ? ?Functional and Nutritional State: ? ?Prior to hospitalization Kyliegh needed help with bADL's, She and her spouse had caregivers come in four times a week to support her. The would help bath Remo Lipps and organize/clean the home.  ? ?Taleen had a pretty decent appetite prior to hospitalization.  ? ?Advance Directives: ? ?A detailed discussion was had today regarding advanced directives. Patient spouse shares that they have these at the lawyers office and he will request a copy.  ? ?Code Status: ? ?Encouraged patient/family to consider DNR/DNI status understanding evidenced based poor outcomes in similar hospitalized patient, as the cause of arrest is likely associated with advanced chronic/terminal illness rather than an easily reversible acute cardio-pulmonary event. I explained that DNR/DNI does not change the medical plan and it only comes into effect after a person has arrested (died).  It is a protective measure to keep [place] from harming the patient in their last moments of life.  ? ?Patient spouse, Delfino Lovett shares that he presently wants, "everything done" to preserve Kenecia's life. I shared the concern that we may cause her more harm than benefit with resuscitation efforts. Hilda Blades stated that she does agree that this is worth talking about  more as a family but not "right now nor in the presence of Julieana." I shared that there is no urgency to decide on anything but having an open and honest dialogue is important moving  forward regarding these matters.  ? ?Provided "Hard Choices for Loving People" booklet.  ? ?Discussion: ? ?As above a formal review of Joans past medical history was held. We discussed that overtime, most notably since the start of dialysis Martrice has declined. After the trauma of her left hip she declined further and now her right hip has required replacement. ? ?We discussed that each hospitalization takes quite a bit out of an elderly patient and we start to see their baseline level ov function decline further after every hospitalization. Hilda Blades states, this is the trend that she has seen though Damarys tends to "bounce back". She does agree that each time she bounced back her condition has been lessened. We reviewed the reasons for this in the setting of prolonged hospital stays with less and less reserve as we age.  ? ?Family expresses that their goals are for Michaelene's continued improvement. They are curious to better understand her seizures and request a conversation with the neurology team.  ? ?Discussed the importance of continued conversation with family and their  medical providers regarding overall plan of care and treatment options, ensuring decisions are within the context of the patients values and GOCs. ? ?Decision Maker: ?Acelyn Basham (Spouse) [phone] ? ?SUMMARY OF RECOMMENDATIONS   ?FULL CODE/Full Scope of Care --> Patient family has agreed to talk more about this though spouse is clear towards aggressive measures presently ? ?MOST Provided for family review ? ?Patient Spouse shares he will provide Advance Directives ? ?Goals are for improvement presently ? ?[facility] team updating patients family ? ?Ongoing Palliative Support ? ?Code Status/Advance Care Planning: ?FULL CODE ? ?Palliative Prophylaxis:  ?Aspiration, Bowel Regimen, Delirium Protocol, Frequent Pain Assessment, Oral Care, Palliative Wound Care, and Turn Reposition ? ?Additional Recommendations (Limitations, Scope,  Preferences): ?Continue current treatment, no limites presently ? ?Psycho-social/Spiritual:  ?Desire for further Chaplaincy support: No, not religious ?Additional Recommendations: Education on chronic co-morbidities ?  ?Prognosis: Concerning given patients multiple co-morbid conditions and recent injury to her hip ? ?Discharge Planning: Discharge to a skilled nursing home when medically optimized. ? ?Vitals:  ? 09/24/21 1235 09/24/21 1505  ?BP: (!) 182/83 (!) 147/64  ?Pulse: 86 74  ?Resp: 20 20  ?Temp: 98.3 ?F (36.8 ?C)   ?SpO2: 98% 96%  ? ? ?Intake/Output Summary (Last 24 hours) at 09/24/2021 1614 ?Last data filed at 09/24/2021 1500 ?Gross per 24 hour  ?Intake 621.29 ml  ?Output 1500 ml  ?Net -878.71 ml  ? ?Last Weight  Most recent update: 09/22/2021 11:49 AM  ? ? Weight  ?59.8 kg (131 lb 13.4 oz)  ?      ? ?  ? ?Gen:  Frail elderly F ?HEENT: moist mucous membranes ?CV: Regular rate and rhythm ?PULM: On RA, breathing comfortably ?ABD: soft/nontender ?EXT: RLE edema ?Neuro: Alert and oriented x1 ? ?PPS: 30% ? ? ?This conversation/these recommendations were discussed with patient primary care team, Dr. Karleen Hampshire ? ?[facility] ?_____________________________________________________ ?Tacey Ruiz ?[facility] Team ?Team Cell Phone: [phone] ?Please utilize secure chat with additional questions, if there is no response within 30 minutes please call the above phone number ? ?Palliative Medicine Team providers are available by phone from 7am to 7pm daily and can be reached through the team cell phone.  ?Should this  patient require assistance outside of these hours, please call the patient's attending physician. ? ? ?

## 2021-09-24 NOTE — Progress Notes (Signed)
?[facility] ?Progress Note  ? ?Assessment/ Plan:   ?Assessment/Plan: ?1 R hip fracture: 2/2 mechanical fall.  s/p R hemiarthroplasty with ortho 09/22/21 ?2. Seizure activity: had some activity 4/6 repeat CT head without new stroke/ worsening of SAH.  Got Ativan and Keppra.  Neurology following ?3.  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  NO surgical intervention recommended  ?4 ESRD: TTS with Dr Johny Blamer HD 09/22/21, HD 09/24/21- gentle UF goal  ?5 Hypertension: improved with IV pushes of hydralazine and labetalol yesterday, will be able to take PO meds today and anticipate more improvement following UF on HD ?6. Anemia of ESRD: Hgb 10._. 8.8, will give ESA today ?7. Metabolic Bone Disease: Renvela as binder, Phos 4.1 ?8  Dispo: admitted ? ?Subjective:   ? ?Seen and examined on HD.  BFR 300 mL/ min via AVF (some mild leaking arterial needle, PCT notified), UF goal 1.5L.  Tolerating rx well.    ? ?Objective:   ?BP (!) 154/64   Pulse 73   Temp 98 ?F (36.7 ?C) (Temporal)   Resp 14   Ht 5' 6"  (1.676 m)   Wt 59.8 kg   SpO2 100%   BMI 21.28 kg/m?  ? ?Physical Exam: ?GEN sitting up in HD, confused but awake ?HEENT bilateral orbit ecchymoses, R forehead lac ?NECK no JVD ?PULM clear anteriorly ?CV RRR ?ABD; soft ?EXT no LE edema, R leg bent ?NEURO alert and awake but confused ?ACCESS: R AVF + T/B, accessed ? ?Labs: ?BMET ?Recent Labs  ?Lab 09/20/21 ?1809 09/20/21 ?1817 09/20/21 ?2307 09/21/21 ?2131 09/22/21 ?0145 09/22/21 ?1457 09/22/21 ?1854 09/23/21 ?0134 09/24/21 ?0143  ?NA 140 140  --  141 140 138  --  138 139  ?K 4.9 4.8  --  4.8 4.8 3.7  --  4.3 3.9  ?CL 103 102  --  104 106 98  --  101 103  ?CO2 26  --   --  27 25  --   --  25 25  ?GLUCOSE 93 95  --  85 77 71  --  86 81  ?BUN 17 22  --  31* 34* 15  --  24* 37*  ?CREATININE 3.84* 3.80*  --  5.86* 6.16* 2.90* 3.31* 3.77* 5.44*  ?CALCIUM 9.0  --   --  9.1 9.1  --   --  8.6* 8.5*  ?PHOS  --   --  4.1 6.5*  --   --   --   --   --   ? ?CBC ?Recent Labs   ?Lab 09/20/21 ?2307 09/21/21 ?2131 09/22/21 ?0145 09/22/21 ?1457 09/22/21 ?1854 09/23/21 ?0134 09/24/21 ?0143  ?WBC 15.2*   < > 9.8  --  12.1* 9.1 7.1  ?NEUTROABS 13.9*  --   --   --   --   --   --   ?HGB 10.7*   < > 10.5* 10.5* 9.7* 9.3* 8.8*  ?HCT 33.2*   < > 32.9* 31.0* 30.9* 29.7* 27.3*  ?MCV 97.6   < > 96.8  --  97.5 97.4 95.8  ?PLT 174   < > 170  --  142* 134* 146*  ? < > = values in this interval not displayed.  ? ? ?  ?Medications:   ? ? amLODipine  5 mg Oral Daily  ? atorvastatin  20 mg Oral QHS  ? chlorhexidine gluconate (MEDLINE KIT)  15 mL Mouth Rinse BID  ? Chlorhexidine Gluconate Cloth  6 each Topical Q0600  ?  docusate sodium  100 mg Oral BID  ? feeding supplement (NEPRO CARB STEADY)  237 mL Oral BID BM  ? heparin injection (subcutaneous)  5,000 Units Subcutaneous Q8H  ? hydrALAZINE  100 mg Oral Q8H  ? labetalol  300 mg Oral BID  ? levothyroxine  75 mcg Oral QAC breakfast  ? LORazepam  2 mg Intravenous Once  ? mouth rinse  15 mL Mouth Rinse 10 times per day  ? multivitamin  1 tablet Oral QHS  ? senna  1 tablet Oral BID  ? sertraline  150 mg Oral Daily  ? sevelamer carbonate  2,400 mg Oral TID WC  ? ? ? ?Madelon Lips MD ?09/24/2021, 10:15 AM   ?

## 2021-09-24 NOTE — Progress Notes (Signed)
Patient transported to dialysis.  ? ?Husband called, given update on patient this AM. Will be at bedside this afternoon.  ?

## 2021-09-24 NOTE — Plan of Care (Signed)

## 2021-09-25 ENCOUNTER — Encounter (HOSPITAL_COMMUNITY): Payer: Self-pay | Admitting: Orthopedic Surgery

## 2021-09-25 DIAGNOSIS — R569 Unspecified convulsions: Secondary | ICD-10-CM | POA: Diagnosis not present

## 2021-09-25 DIAGNOSIS — I5022 Chronic systolic (congestive) heart failure: Secondary | ICD-10-CM | POA: Diagnosis not present

## 2021-09-25 DIAGNOSIS — S72001D Fracture of unspecified part of neck of right femur, subsequent encounter for closed fracture with routine healing: Secondary | ICD-10-CM | POA: Diagnosis not present

## 2021-09-25 DIAGNOSIS — Z01810 Encounter for preprocedural cardiovascular examination: Secondary | ICD-10-CM

## 2021-09-25 DIAGNOSIS — E782 Mixed hyperlipidemia: Secondary | ICD-10-CM | POA: Diagnosis not present

## 2021-09-25 DIAGNOSIS — Z515 Encounter for palliative care: Secondary | ICD-10-CM | POA: Diagnosis not present

## 2021-09-25 DIAGNOSIS — I10 Essential (primary) hypertension: Secondary | ICD-10-CM | POA: Diagnosis not present

## 2021-09-25 LAB — CBC
HCT: 28 % — ABNORMAL LOW (ref 36.0–46.0)
Hemoglobin: 9.1 g/dL — ABNORMAL LOW (ref 12.0–15.0)
MCH: 31.1 pg (ref 26.0–34.0)
MCHC: 32.5 g/dL (ref 30.0–36.0)
MCV: 95.6 fL (ref 80.0–100.0)
Platelets: 147 10*3/uL — ABNORMAL LOW (ref 150–400)
RBC: 2.93 MIL/uL — ABNORMAL LOW (ref 3.87–5.11)
RDW: 14 % (ref 11.5–15.5)
WBC: 7.5 10*3/uL (ref 4.0–10.5)
nRBC: 0 % (ref 0.0–0.2)

## 2021-09-25 MED ORDER — HYDROCODONE-ACETAMINOPHEN 7.5-325 MG PO TABS
1.0000 | ORAL_TABLET | ORAL | Status: DC | PRN
  Administered 2021-09-26 – 2021-09-27 (×2): 1 via ORAL
  Filled 2021-09-25 (×2): qty 1

## 2021-09-25 MED ORDER — HYDROCODONE-ACETAMINOPHEN 5-325 MG PO TABS: 1.0000 | ORAL_TABLET | ORAL | Status: DC | PRN

## 2021-09-25 NOTE — Procedures (Addendum)
Patient Name: Katherine Ponce  ?MRN: [id number]  ?Epilepsy Attending: Lora Havens  ?Referring Physician/Provider: Kerney Elbe, MD ?Duration: 09/24/2021 1611 to 09/25/2021 1611 ?  ?Patient history: 81 y.o female with PMH of chronic systolic HF, ESRD on HD TTS, HTN, HLD, Hypothyroidism, brain aneurysm admitted with acute right hip fracture secondary to mechanical fall and small tSAH/SDH now complicated by seizure like activity. EEG to evaluate for seizure ?  ?Level of alertness: Awake, asleep ?  ?AEDs during EEG study: LEV ?  ?Technical aspects: This EEG study was done with scalp electrodes positioned according to the 10-20 International system of electrode placement. Electrical activity was acquired at a sampling rate of 500Hz  and reviewed with a high frequency filter of 70Hz  and a low frequency filter of 1Hz . EEG data were recorded continuously and digitally stored.  ?  ?Description: The posterior dominant rhythm consists of 9 Hz activity of moderate voltage (25-35 uV) seen predominantly in posterior head regions, symmetric and reactive to eye opening and eye closing. Sleep was characterized by vertex waves, sleep spindles (12 to 14 Hz), maximal frontocentral region.  EEG showed intermittent sharply contoured 6-7hz  theta slowing in left frontal region consistent with breach artifact.  ? Hyperventilation and photic stimulation were not performed.    ?  ?ABNORMALITY ?- Breach artifact, left frontal region ?  ?IMPRESSION: ?This study is suggestive of cortical dysfunction arising from left frontal region consistent with prior craniotomy. No seizures or definite epileptiform discharges were seen throughout the recording. ?  ?Lora Havens  ?  ?

## 2021-09-25 NOTE — Progress Notes (Addendum)
?[facility] ?Progress Note  ? ?Assessment/ Plan:   ?Assessment/Plan: ?1 R hip fracture: 2/2 mechanical fall.  s/p R hemiarthroplasty with ortho 09/22/21 ?2. Seizure activity: had some activity 4/6 repeat CT head without new stroke/ worsening of SAH.  Got Ativan and Keppra.  Neurology following, getting cEEG ?3.  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  NO surgical intervention recommended  ?4 ESRD: TTS with Dr Johny Blamer HD 09/22/21, HD 09/24/21, next 09/27/21 ?5 Hypertension: improved  ?6. Anemia of ESRD: Hgb 10 --> 8.8, ESA given Saturday ?7. Metabolic Bone Disease: Renvela as binder, Phos 4.1 ?8  Dispo: admitted ? ?Subjective:   ? ?Seen in room.  Getting cEEG.  No complaints- says pain is better than yesterday.  ? ?Objective:   ?BP (!) 167/84   Pulse 77   Temp 98.3 ?F (36.8 ?C) (Oral)   Resp 20   Ht 5' 6"  (1.676 m)   Wt 59.8 kg   SpO2 99%   BMI 21.28 kg/m?  ? ?Physical Exam: ?GEN sitting in bed, NAD ?HEENT bilateral orbit ecchymoses, R forehead lac ?NECK no JVD ?PULM clear anteriorly ?CV RRR ?ABD; soft ?EXT no LE edema, R leg bent ?NEURO alert and awake but confused ?ACCESS: R AVF + T/B, accessed ? ?Labs: ?BMET ?Recent Labs  ?Lab 09/20/21 ?1809 09/20/21 ?1817 09/20/21 ?2307 09/21/21 ?2131 09/22/21 ?0145 09/22/21 ?1457 09/22/21 ?1854 09/23/21 ?0134 09/24/21 ?0143  ?NA 140 140  --  141 140 138  --  138 139  ?K 4.9 4.8  --  4.8 4.8 3.7  --  4.3 3.9  ?CL 103 102  --  104 106 98  --  101 103  ?CO2 26  --   --  27 25  --   --  25 25  ?GLUCOSE 93 95  --  85 77 71  --  86 81  ?BUN 17 22  --  31* 34* 15  --  24* 37*  ?CREATININE 3.84* 3.80*  --  5.86* 6.16* 2.90* 3.31* 3.77* 5.44*  ?CALCIUM 9.0  --   --  9.1 9.1  --   --  8.6* 8.5*  ?PHOS  --   --  4.1 6.5*  --   --   --   --   --   ? ?CBC ?Recent Labs  ?Lab 09/20/21 ?2307 09/21/21 ?2131 09/22/21 ?1854 09/23/21 ?0134 09/24/21 ?0143 09/25/21 ?0133  ?WBC 15.2*   < > 12.1* 9.1 7.1 7.5  ?NEUTROABS 13.9*  --   --   --   --   --   ?HGB 10.7*   < > 9.7* 9.3* 8.8*  9.1*  ?HCT 33.2*   < > 30.9* 29.7* 27.3* 28.0*  ?MCV 97.6   < > 97.5 97.4 95.8 95.6  ?PLT 174   < > 142* 134* 146* 147*  ? < > = values in this interval not displayed.  ? ? ?  ?Medications:   ? ? amLODipine  5 mg Oral Daily  ? atorvastatin  20 mg Oral QHS  ? chlorhexidine gluconate (MEDLINE KIT)  15 mL Mouth Rinse BID  ? Chlorhexidine Gluconate Cloth  6 each Topical Q0600  ? darbepoetin (ARANESP) injection - DIALYSIS  100 mcg Intravenous Q Sat-HD  ? docusate sodium  100 mg Oral BID  ? feeding supplement (NEPRO CARB STEADY)  237 mL Oral BID BM  ? heparin injection (subcutaneous)  5,000 Units Subcutaneous Q8H  ? hydrALAZINE  100 mg Oral Q8H  ? labetalol  300 mg Oral BID  ? levothyroxine  75 mcg Oral QAC breakfast  ? LORazepam  2 mg Intravenous Once  ? multivitamin  1 tablet Oral QHS  ? senna  1 tablet Oral BID  ? sertraline  150 mg Oral Daily  ? sevelamer carbonate  2,400 mg Oral TID WC  ? ? ? ?Madelon Lips MD ?09/25/2021, 11:23 AM   ?

## 2021-09-25 NOTE — Progress Notes (Signed)
Neurology Progress Note ? ? ?S:// ?Patient is in bed with bilateral mitts on in NAD. Sitter is at bedside. No family at bedside. cEEG in place. Per sitter patient slept well. She is oriented to self. No new neurological events ? ? ?O:// ?Current vital signs: ?BP (!) 177/71   Pulse 76   Temp 98.3 ?F (36.8 ?C) (Oral)   Resp 20   Ht 5\' 6"  (1.676 m)   Wt 59.8 kg   SpO2 96%   BMI 21.28 kg/m?  ?Vital signs in last 24 hours: ?Temp:  [97.7 ?F (36.5 ?C)-98.3 ?F (36.8 ?C)] 98.3 ?F (36.8 ?C) (04/08 1235) ?Pulse Rate:  [73-86] 76 (04/08 2144) ?Resp:  [14-23] 20 (04/08 1505) ?BP: (136-182)/(64-89) 177/71 (04/09 9833) ?SpO2:  [96 %-100 %] 96 % (04/08 1505) ? ?GENERAL: Awake, alert in NAD ?HEENT: - Normocephalic and atraumatic, dry mm. Bruising on neck and right side of face ?LUNGS - Clear to auscultation bilaterally with no wheezes ?CV - S1S2 RRR, no m/r/g, equal pulses bilaterally. ?ABDOMEN - Soft, nontender, nondistended with normoactive BS ?Ext: warm, well perfused, intact peripheral pulses, +1 edema right upper arm. AV graft in right arm  ? ?NEURO:  ?Mental Status: Awake alert and oriented to self. Follows commands ?Speech: clear ?Visual: full to finger counting ?Pupils: equal and reactive ?EOM: intact  ?Corneal reflex: not tested in this awake patient ?Face: symmetric ?Cough/gag: not tested in this awake patient ?Motor: Right upper 5/5, Left upper 5/5, left lower 4/5, right lower withdraws to pain, hurts to move ?Tone: is normal and bulk is normal ?Sensation- Intact to light touch bilaterally ?Coordination: FTN intact ?Gait- deferred ? ? ?Imaging ?I have reviewed images in epic and the results pertinent to this consultation are: ? ?CT-scan of the brain 4/7 ?1. No acute intracranial abnormality. ?2. 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 ? ?rEEG 4/7: ?This study is suggestive of cortical dysfunction arising from left frontal region consistent with prior craniotomy. Additionally  there is moderate diffuse encephalopathy, nonspecific etiology. No seizures or definite epileptiform discharges were seen throughout the recording. ? ?cEEG 4/8-4/9: ? This study is suggestive of cortical dysfunction arising from left frontal region consistent with prior craniotomy. No seizures or definite epileptiform discharges were seen throughout the recording ? ?Assessment: 81 y.o. female with past medical history of end-stage renal disease on hemodialysis, hypertension, hyperlipidemia, hypothyroidism, dementia, history of brain aneurysm status-post repair, systolic congestive heart failure who presented on 09/20/2021 after a fall while trying to maneuver around the chair.  CT head at that time showed trace subarachnoid hemorrhage.  She was also noted to have right hip fracture and is status post arthroplasty on 09/22/2021. ?The morning of 4/7 patient had an episode of "shaking of her whole body" and then unresponsiveness.  Rapid response was called.  On arrival, per rapid response nurse documentation, patient was arousable and localizing to noxious stimuli. A few hours later a code blue was called for 2 episodes of unresponsiveness and generalized tonic-clonic seizure-like activity.  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  Patient was loaded with Keppra 1 g and started on 500 mg twice daily ?- DDx: New onset seizures, most likely due to old left MCA territory infarction serving as an epileptogenic focus  ?- EEG performed Friday: Continuous slow, generalized and maximal left frontal region. Breach artifact, left frontal region. This study is suggestive of cortical dysfunction arising from the left frontal region consistent with  prior craniotomy. Additionally there is moderate diffuse encephalopathy, nonspecific to etiology. No seizures or definite epileptiform discharges were seen throughout the recording. ?- Acute encephalopathy, most likely due to post ictal state. A  toxic/metabolic encephalopathy may also be contributing.  ?- CT head: Old left MCA territory infarct with ex vacuo dilatation of the frontal horn of the left lateral ventricle. Multiple vascular clips are seen at the skull base. Remote left craniotomy also noted. ? ? ?Recommendations: ?- Seizure precautions  ?- Continue Keppra 1000 mg po qd with supplemental dose of 500 mg after dialysis sessions.   ?- PRN IV Ativan for clinical sz ?- D/C LTM  ?- Management of rest of comorbidities per primary team ? ?Beulah Gandy DNP, ACNPC-AG  ? ?Electronically signed: Dr. Kerney Elbe ? ? ?

## 2021-09-25 NOTE — Progress Notes (Signed)
? ?Palliative Medicine Inpatient Follow Up Note ? ? ?HPI: ?81 year old female prior history of chronic systolic heart failure renal disease on dialysis on Tuesday Thursday and Saturday, hypertension, hyperlipidemia, hypothyroidism generalized anxiety disorder, dementia, history of brain aneurysm mild repair presented to ED with a mechanical fall. She was found to have a subcapital acute fracture of the right proximal femur, underwent surgical repair. Post op, around 4 am pt had two episodes of unresponsiveness followed by generalized tonic clonic seizures lasting less than a minute. EEG ordered and neurology consulted for recommendations. She was given a dose of IV ativan and IV keppra loading dose followed by BID IV keppra. Pt seen and examined today. ?  ?Palliative care has been asked to get involved to further address goals of care.  ? ?Today's Discussion (09/25/2021): ? ?*Please note that this is a verbal dictation therefore any spelling or grammatical errors are due to the "[employer]" system interpretation. ? ?Chart reviewed inclusive of vital signs, progress notes, laboratory results, and diagnostic images.  ? ?I met with Katherine Ponce at bedside. She has a Mudlogger who was sitting as she had pulled at her EEG monitor intermittently throughout the night.  ? ?Katherine Ponce is assessed this morning. She denies nausea, dyspnea, or pain unless she moves her RLE. She shares that she knows her name though when asked where she was she was disoriented. We reviewed that she fell and had a right hip fracture which needed surgical repair. I shared that she had also endured seizures while hospitalized which is why she has probes on her head. She expresses some confusions with this information.  ? ?Patients family was not present at bedside though plan to round again later to see if they have any additional Palliative concerns. ?______________________ ?Addendum: ? ?I met with Katherine Ponce, her spouse and daughter this afternoon.  We reviewed the plan for EEG per neurology. Discussed Katherine Ponce's ongoing delirium and poor appetite. Family remains optimistic. Questions and concerns addressed.  ? ?Objective Assessment: ?Vital Signs ?Vitals:  ? 09/25/21 0619 09/25/21 0837  ?BP: (!) 177/71 (!) 167/84  ?Pulse:  77  ?Resp:  20  ?Temp:    ?SpO2:  99%  ? ? ?Intake/Output Summary (Last 24 hours) at 09/25/2021 0953 ?Last data filed at 09/24/2021 1843 ?Gross per 24 hour  ?Intake 277.89 ml  ?Output 1500 ml  ?Net -1222.11 ml  ? ?Last Weight  Most recent update: 09/22/2021 11:49 AM  ? ? Weight  ?59.8 kg (131 lb 13.4 oz)  ?      ? ?  ? ?Gen:  Frail elderly F ?HEENT: Facial bruises, moist mucous membranes ?CV: Regular rate and rhythm ?PULM: On RA, breathing comfortably ?ABD: soft/nontender ?EXT: RLE edema ?Neuro: Alert and oriented x1 ? ?SUMMARY OF RECOMMENDATIONS   ?FULL CODE/Full Scope of Care --> Patient family has agreed to talk more about this though spouse is clear towards aggressive measures presently ?  ?MOST Provided for family review ?  ?Patient Spouse shares he will provide Advance Directives ?  ?Goals are for improvement presently ?  ?Appreciate Neurology's involvement ?  ?Ongoing incremental Palliative Support ? ?MDM - High ?______________________________________________________________________________________ ?Tacey Ruiz ?[facility] Team ?Team Cell Phone: [phone] ?Please utilize secure chat with additional questions, if there is no response within 30 minutes please call the above phone number ? ?Palliative Medicine Team providers are available by phone from 7am to 7pm daily and can be reached through the team cell phone.  ?Should this patient require  assistance outside of these hours, please call the patient's attending physician. ? ? ? ? ?

## 2021-09-25 NOTE — Progress Notes (Signed)
LTM maint complete - no skin breakdown under: Fp1 Fp2 [facility] monitored, Event button test confirmed by [facility].  

## 2021-09-25 NOTE — Progress Notes (Addendum)
ORTHOPAEDIC PROGRESS NOTE ? ?s/p Procedure(s): ?RIGHT POSTERIOR APPROACH HEMI HIP ARTHROPLASTY on 4/6 ? ?SUBJECTIVE: ?Patient more awake and responsive today. Overnight EEG has been ordered. Better movement of the right hip ? ?OBJECTIVE: ?PE: ?RLE: dressing CDI. Patient able to actively straighten her leg. intact EHL/TA/GSC. Endorses distal sensation. warm well perfused foot ? ? ?Vitals:  ? 09/24/21 2144 09/25/21 0619  ?BP: (!) 152/89 (!) 177/71  ?Pulse: 76   ?Resp:    ?Temp:    ?SpO2:    ? ?Stable post-op images.  ? ?ASSESSMENT: ?Katherine Ponce is a 81 y.o. female POD#3 with new onset seizure ? ?PLAN: ?Weightbearing: WBAT ?Insicional and dressing care: Reinforce dressings as needed ?Orthopedic device(s): None ?Showering:Hold for now ?VTE prophylaxis: Heparin per primary team ?Pain control: PRN pain medications, minimize as able. Discontinued IV pain medications. Minimize oral narcotics as able to prevent worsening mental status.  ?Follow - up plan: 2 weeks in office ?Dispo: TBD. 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 PT/OT has been delayed as patient is undergoing this work-up.  ?Contact information:  After hours and holidays please check [email or web address] for group call information for [facility] ?  ? ?Noemi Chapel, PA-C ?09/25/21 ? ? ?

## 2021-09-25 NOTE — Progress Notes (Signed)
? ? ? [facility] ?                                                                            ? ? ?Katherine Ponce, is a 81 y.o. female, DOB - 22-Aug-1940, [id number] ?Admit date - 09/20/2021    ?Outpatient Primary MD for the patient is Kristopher Glee., MD ? ?LOS - 5  days ? ? ? ?Brief summary  ? 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 She was found to have a subcapital acute fracture of the right proximal femur, underwent surgical repair. Post op, around 4 am pt had two episodes of unresponsiveness followed by generalized tonic clonic seizures lasting less than a minute. EEG ordered and neurology consulted for recommendations. She was given a dose of IV ativan and IV keppra loading dose followed by BID IV keppra.  ?Pt seen and examined with family at bedside.  ? ? ?Assessment & Plan  ? ? ?Assessment and Plan: ? ? ? ?Generalized tonic-clonic seizures followed by postictal somnolence. ?Patient received IV Ativan and IV Keppra 1 g followed by Keppra 500 mg twice daily. ?EEG was done shows continuous slow, generalized from the left frontal area where she had prior craniotomy.  She also has moderate diffuse encephalopathy of nonspecific etiology. ?Neurology consulted will wait for recommendations. ?CT of the head shows old strokes, no acute strokes seen. ?Unable to do MRI of the brain without contrast as patient has a history of brain aneurysm repair with metal clippings from [place]. ?Pt 's lethargy improved, but remains confused.  ?She was started on IV Keppra 500 mg Tuesday, Thursday and Saturday.  ?She was started on LTM EEG , suggestive of cortical dysfunction arising from left frontal region consistent with prior craniotomy. No seizures or def epileptiform  discharges seen.  ? ? ? ?Acute respiratory failure with hypoxia  ?Weaned off oxygen.  ?Possibly secondary to IV Ativan/sedation ?Chest x-ray shows Stable mild cardiomegaly with suggestion of mild vascular congestion. Slight worsening hazy left base/retrocardiac opacification likely effusion/atelectasis. ?Fluid management as per HD. ?Patient afebrile and WBC count within normal limits ? ?Acute metabolic encephalopathy probably secondary to seizures and postictal phase.   ?Pt is more alert and answering simple questions. Ammonia level wnl. Vit B 12 levels are elevated. Folate level high.  ?She is oriented to person only.  ? ? ?Mechanical fall ?X-ray showing  [place]verse subcapital acute fracture of the right proximal femur with varus angulation. ?Pain control. ?Orthopedics consulted plan for surgical repair as per orthopedics.  She underwent right posterior hemi hip arthroplasty.  ?Cardiology consulted for pre op clearance.  ?Bedside echo showed moderate LV dysfunction .  ?Unable to do PT, as pt developed seizures post op  and had to be on LTM EEG.  ? ?Essential hypertension ?Blood pressure parameters are slightly elevated. Pt currently on norvasc 5 mg daily , hydralazine 100 mg TID, Labetalol 300 mg bid, prn IV hydralazine.  ? ?Mechanical fall with CT of the head showing  Very small amount of subarachnoid  blood over the right convexity. ?Neurosurgery consulted and recommended no further work-up.   ? ? ?End-stage renal disease on dialysis ?Nephrology on board  for HD, tths. ?Appreciate recommendations.  ? ? ?Hyperlipidemia ?Continue with statin. ? ? ?Generalized anxiety disorder ?Resume home meds.  ? ? ? ?Dementia ?No behavioral abnormalities. ? ? ? ?Anemia of chronic disease ?Stable. Normocytic.  ?Transfuse to keep hemoglobin greater than 7 ? ? ?Leukocytosis ?Resolved.  ?Unclear etiology. CT chest abdomen and pelvis do not show any infection.  ? ? 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 ?Recommend outpatient follow-up with vascular surgery.  ? ? ?Chronic combined CHF, with moderate pulmonary hypertension ?Pt appears compensated. Pt is on torsemide at home which is on hold for now.   ?Echocardiogram reviewed .  ? ? ?Elevated troponin:  ?From demand ischemia and ESRD.  ? ? ?Hypothyroidism: ?TSH wnl. Resume synthroid.  ?Interventions: Refer to RD note for recommendations ? ?Estimated body mass index is 21.28 kg/m? as calculated from the following: ?  Height as of this encounter: 5' 6"  (1.676 m). ?  Weight as of this encounter: 59.8 kg. ? ?Code Status: full code.  ?DVT Prophylaxis:  heparin injection 5,000 Units Start: 09/23/21 0930 ?SCDs Start: 09/22/21 1815 ?SCDs Start: 09/21/21 2321 ? ? ?Level of Care: Level of care: Progressive ?Family Communication: none at bedside.  ? ?Disposition Plan:     Remains inpatient appropriate: LTM EEG ? ?Procedures:  ?LTM EEG.  ? ?Consultants:   ?Orthopedics ?Cardiology.  ?Neurology  ?Nephrology.  ? ?Antimicrobials:  ? ?Anti-infectives (From admission, onward)  ? ? Start     Dose/Rate Route Frequency Ordered Stop  ? 09/23/21 0600  ceFAZolin (ANCEF) IVPB 2g/100 mL premix       ? 2 g ?200 mL/hr over 30 Minutes Intravenous On call to O.R. 09/22/21 1319 09/22/21 1540  ? 09/23/21 0500  ceFAZolin (ANCEF) IVPB 2g/100 mL premix  Status:  Discontinued       ? 2 g ?200 mL/hr over 30 Minutes Intravenous Every 12 hours 09/22/21 1814 09/22/21 1915  ? ?  ? ? ? ?Medications ? ?Scheduled Meds: ? amLODipine  5 mg Oral Daily  ? atorvastatin  20 mg Oral QHS  ? chlorhexidine gluconate (MEDLINE KIT)  15 mL Mouth Rinse BID  ? Chlorhexidine Gluconate Cloth  6 each Topical Q0600  ? darbepoetin (ARANESP) injection - DIALYSIS  100 mcg Intravenous Q Sat-HD  ? docusate sodium  100 mg Oral BID  ? feeding supplement (NEPRO CARB STEADY)  237 mL Oral BID BM  ? heparin injection (subcutaneous)  5,000 Units Subcutaneous Q8H  ? hydrALAZINE  100 mg Oral Q8H  ? labetalol   300 mg Oral BID  ? levothyroxine  75 mcg Oral QAC breakfast  ? LORazepam  2 mg Intravenous Once  ? multivitamin  1 tablet Oral QHS  ? senna  1 tablet Oral BID  ? sertraline  150 mg Oral Daily  ? sevelamer carbonate  2,400 mg Oral TID WC  ? ?Continuous Infusions: ? sodium chloride    ? sodium chloride    ? sodium chloride 5 mL/hr (09/24/21 5397)  ? levETIRAcetam 1,000 mg (09/25/21 1009)  ? [START ON 09/27/2021] levETIRAcetam    ? methocarbamol (ROBAXIN) IV    ? ?PRN Meds:.sodium chloride, sodium chloride, acetaminophen, albuterol, alteplase, heparin, hydrALAZINE, HYDROcodone-acetaminophen, HYDROcodone-acetaminophen, labetalol, lidocaine (PF), lidocaine-prilocaine, menthol-cetylpyridinium **OR** phenol, methocarbamol **OR** methocarbamol (ROBAXIN) IV, ondansetron **OR** ondansetron (ZOFRAN) IV,  pentafluoroprop-tetrafluoroeth, polyethylene glycol ? ? ? ?Subjective:  ? ?Katherine Ponce was seen and examined today.  Pt alert and answering simple questions.  ? ?Objective:  ? ?Vitals:  ? 09/24/21 1940 09/24/21 2144 09/25/21 8978 09/25/21 0837  ?BP: 136/87 (!) 152/89 (!) 177/71 (!) 167/84  ?Pulse:  76  77  ?Resp:    20  ?Temp:      ?TempSrc:      ?SpO2:    99%  ?Weight:      ?Height:      ? ? ?Intake/Output Summary (Last 24 hours) at 09/25/2021 1527 ?Last data filed at 09/24/2021 1843 ?Gross per 24 hour  ?Intake 167.5 ml  ?Output --  ?Net 167.5 ml  ? ? ?Filed Weights  ? 09/22/21 0727 09/22/21 1130  ?Weight: 59.5 kg 59.8 kg  ? ? ? ?Exam ? ?General exam: Ill appearing lady , off oxygen, not in distress.  ?Respiratory system: Clear to auscultation. Respiratory effort normal. ?Cardiovascular system: S1 & S2 heard, RRR. No JVD,  No pedal edema. ?Gastrointestinal system: Abdomen is nondistended, soft and nontender. Normal bowel sounds heard. ?Central nervous system: Alert and oriented to person only, able to recognize family members.  ?Extremities: no pedal edema, painful ROM.  ?Skin: No rashes seen ?Psychiatry: Mood is appropriate.   ? ? ?Data Reviewed:  I have personally reviewed following labs and imaging studies ? ? ?CBC ?Lab Results  ?Component Value Date  ? WBC 7.5 09/25/2021  ? RBC 2.93 (L) 09/25/2021  ? HGB 9.1 (L) 09/25/2021  ? HCT

## 2021-09-26 DIAGNOSIS — R569 Unspecified convulsions: Secondary | ICD-10-CM | POA: Diagnosis not present

## 2021-09-26 DIAGNOSIS — I10 Essential (primary) hypertension: Secondary | ICD-10-CM | POA: Diagnosis not present

## 2021-09-26 DIAGNOSIS — E782 Mixed hyperlipidemia: Secondary | ICD-10-CM | POA: Diagnosis not present

## 2021-09-26 DIAGNOSIS — I5022 Chronic systolic (congestive) heart failure: Secondary | ICD-10-CM | POA: Diagnosis not present

## 2021-09-26 DIAGNOSIS — S72001D Fracture of unspecified part of neck of right femur, subsequent encounter for closed fracture with routine healing: Secondary | ICD-10-CM | POA: Diagnosis not present

## 2021-09-26 MED ORDER — LEVETIRACETAM 500 MG PO TABS
1000.0000 mg | ORAL_TABLET | Freq: Every day | ORAL | Status: DC
  Administered 2021-09-27 – 2021-09-28 (×2): 1000 mg via ORAL
  Filled 2021-09-26 (×3): qty 2

## 2021-09-26 MED ORDER — LEVETIRACETAM 500 MG PO TABS
500.0000 mg | ORAL_TABLET | ORAL | Status: DC
  Administered 2021-09-27: 500 mg via ORAL
  Filled 2021-09-26: qty 1

## 2021-09-26 NOTE — Progress Notes (Signed)
LTM EEG discontinued - no skin breakdown at unhook.   

## 2021-09-26 NOTE — Progress Notes (Signed)
? ? ? [facility] ?                                                                            ? ? ?Katherine Ponce, is a 81 y.o. female, DOB - 1940-10-28, CXK:[id number] ?Admit date - 09/20/2021    ?Outpatient Primary MD for the patient is Kristopher Glee., MD ? ?LOS - 6  days ? ? ? ?Brief summary  ? 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 She was found to have a subcapital acute fracture of the right proximal femur, underwent surgical repair. Post op, around 4 am pt had two episodes of unresponsiveness followed by generalized tonic clonic seizures lasting less than a minute. EEG ordered and neurology consulted for recommendations. She was given a dose of IV ativan and IV keppra loading dose followed by BID IV keppra.  ?Pt seen and examined with family at bedside.  ? ? ?Assessment & Plan  ? ? ?Assessment and Plan: ? ? ? ?Generalized tonic-clonic seizures followed by postictal somnolence. ?Patient received IV Ativan and IV Keppra 1 g followed by Keppra 500 mg twice daily. ?EEG was done shows continuous slow, generalized from the left frontal area where she had prior craniotomy.  She also has moderate diffuse encephalopathy of nonspecific etiology. ?Neurology consulted will wait for recommendations. ?CT of the head shows old strokes, no acute strokes seen. ?Unable to do MRI of the brain without contrast as patient has a history of brain aneurysm repair with metal clippings from [place]. ?She was started on IV Keppra 500 mg Tuesday, Thursday and Saturday, along with 1000 mg daily. Changed to oral today.  ?She was started on LTM EEG , suggestive of cortical dysfunction arising from left frontal region consistent with prior craniotomy. No seizures or def epileptiform  discharges seen.  ?D/c LTM EEG today.  ?Pt is alert and working with PT.  ? ? ? ?Acute respiratory failure with hypoxia  ?Weaned off oxygen.  ?Possibly secondary to IV Ativan/sedation ?Chest x-ray shows Stable mild cardiomegaly with suggestion of mild vascular congestion. Slight worsening hazy left base/retrocardiac opacification likely effusion/atelectasis. ?Fluid management as per HD. ?Patient afebrile and WBC count within normal limits ? ?Acute metabolic encephalopathy probably secondary to seizures and postictal phase.   ?Pt is more alert and answering simple questions. Ammonia level wnl. Vit B 12 levels are elevated. Folate level high.  ?She is oriented to person only.  ? ? ?Mechanical fall ?X-ray showing  Transverse subcapital acute fracture of the right proximal femur with varus angulation. ?Pain control. ?Orthopedics consulted plan for surgical repair as per orthopedics.  She underwent right posterior hemi hip arthroplasty.  ?Cardiology consulted for pre op clearance.  ?Bedside echo showed moderate LV dysfunction .  ?Therapy evaluations today.  ? ?Essential hypertension ?Blood pressure parameters are better controlled today.  Pt currently on norvasc 5 mg daily , hydralazine 100 mg TID, Labetalol 300 mg bid, prn IV hydralazine.  ? ?Mechanical fall with CT of the head showing  Very small amount of  subarachnoid blood over the right convexity. ?Neurosurgery consulted and recommended no further work-up.   ? ? ?End-stage renal disease on dialysis ?Nephrology on board  for HD, tths. ?Appreciate recommendations.  ? ? ?Hyperlipidemia ?Continue with statin. ? ? ?Generalized anxiety disorder ?Resume home meds.  ? ? ? ?Dementia ?No behavioral abnormalities. ? ? ? ?Anemia of chronic disease ?Stable. Normocytic.  ?Transfuse to keep hemoglobin greater than 7 ? ? ?Leukocytosis ?Resolved.  ?Unclear etiology. CT chest abdomen and pelvis do not show any infection.  ? ? 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 ?Recommend outpatient follow-up with vascular surgery.  ? ? ?Chronic combined CHF, with moderate pulmonary hypertension ?Pt appears compensated. Pt is on torsemide at home which is on hold for now.   ?Echocardiogram reviewed .  ? ? ?Elevated troponin:  ?From demand ischemia and ESRD.  ? ? ?Hypothyroidism: ?TSH wnl. Resume synthroid.  ?Interventions: Refer to RD note for recommendations ? ?Estimated body mass index is 21.28 kg/m? as calculated from the following: ?  Height as of this encounter: 5' 6"  (1.676 m). ?  Weight as of this encounter: 59.8 kg. ? ?Code Status: full code.  ?DVT Prophylaxis:  heparin injection 5,000 Units Start: 09/23/21 0930 ?SCDs Start: 09/22/21 1815 ?SCDs Start: 09/21/21 2321 ? ? ?Level of Care: Level of care: Med-Surg ?Family Communication: family at bedside.  ? ?Disposition Plan:     Remains inpatient appropriate: SNF placement.  ? ?Procedures:  ?LTM EEG.  ? ?Consultants:   ?Orthopedics ?Cardiology.  ?Neurology  ?Nephrology.  ? ?Antimicrobials:  ? ?Anti-infectives (From admission, onward)  ? ? Start     Dose/Rate Route Frequency Ordered Stop  ? 09/23/21 0600  ceFAZolin (ANCEF) IVPB 2g/100 mL premix       ? 2 g ?200 mL/hr over 30 Minutes Intravenous On call to O.R. 09/22/21 1319 09/22/21 1540  ? 09/23/21 0500  ceFAZolin (ANCEF) IVPB 2g/100 mL premix  Status:  Discontinued       ? 2 g ?200 mL/hr over 30 Minutes Intravenous Every 12 hours 09/22/21 1814 09/22/21 1915  ? ?  ? ? ? ?Medications ? ?Scheduled Meds: ? amLODipine  5 mg Oral Daily  ? atorvastatin  20 mg Oral QHS  ? chlorhexidine gluconate (MEDLINE KIT)  15 mL Mouth Rinse BID  ? Chlorhexidine Gluconate Cloth  6 each Topical Q0600  ? darbepoetin (ARANESP) injection - DIALYSIS  100 mcg Intravenous Q Sat-HD  ? docusate sodium  100 mg Oral BID  ? feeding supplement (NEPRO CARB STEADY)  237 mL Oral BID BM  ? heparin injection (subcutaneous)  5,000 Units Subcutaneous Q8H  ? hydrALAZINE  100 mg Oral  Q8H  ? labetalol  300 mg Oral BID  ? [START ON 09/27/2021] levETIRAcetam  1,000 mg Oral Daily  ? [START ON 09/27/2021] levETIRAcetam  500 mg Oral Q T,Th,S,Su  ? levothyroxine  75 mcg Oral QAC breakfast  ? LORazepam  2 mg Intravenous Once  ? multivitamin  1 tablet Oral QHS  ? senna  1 tablet Oral BID  ? sertraline  150 mg Oral Daily  ? sevelamer carbonate  2,400 mg Oral TID WC  ? ?Continuous Infusions: ? sodium chloride    ? sodium chloride    ? methocarbamol (ROBAXIN) IV    ? ?PRN Meds:.sodium chloride, sodium chloride, acetaminophen, albuterol, alteplase, heparin, hydrALAZINE, HYDROcodone-acetaminophen, HYDROcodone-acetaminophen, labetalol, lidocaine (PF), lidocaine-prilocaine, menthol-cetylpyridinium **OR** phenol, methocarbamol **OR** methocarbamol (ROBAXIN) IV, ondansetron **OR** ondansetron (  ZOFRAN) IV, pentafluoroprop-tetrafluoroeth, polyethylene glycol ? ? ? ?Subjective:  ? ?Katherine Ponce was seen and examined today.  Pt oriented to self and answering simple questions.  ? ?Objective:  ? ?Vitals:  ? 09/26/21 0404 09/26/21 0741 09/26/21 0908 09/26/21 0909  ?BP: (!) 160/62 (!) 161/70 (!) 149/60 (!) 149/60  ?Pulse: 62 66 67 67  ?Resp: 15 16 17 16   ?Temp: 97.6 ?F (36.4 ?C) 98 ?F (36.7 ?C)    ?TempSrc: Axillary Oral    ?SpO2: 98% 100% 97% 98%  ?Weight:      ?Height:      ? ? ?Intake/Output Summary (Last 24 hours) at 09/26/2021 1341 ?Last data filed at 09/26/2021 0900 ?Gross per 24 hour  ?Intake 460 ml  ?Output 0 ml  ?Net 460 ml  ? ? ?Filed Weights  ? 09/22/21 0727 09/22/21 1130  ?Weight: 59.5 kg 59.8 kg  ? ? ? ?Exam ? ?General exam: Ill appearing lady not in distress with multiple bruising on the face.  ?Respiratory system: Clear to auscultation. Respiratory effort normal. ?Cardiovascular system: S1 & S2 heard, RRR. No JVD,  No pedal edema. ?Gastrointestinal system: Abdomen is nondistended, soft and nontender.  Normal bowel sounds heard. ?Central nervous system: Alert and oriented. No focal neurological  deficits. ?Extremities: Symmetric 5 x 5 power. ?Skin: No rashes, lesions or ulcers ?Psychiatry:  Mood & affect appropriate.  ? ? ?Data Reviewed:  I have personally reviewed following labs and imaging studies ? ? ?CBC ?Lab

## 2021-09-26 NOTE — Progress Notes (Addendum)
Neurology Progress Note ? ? ?S:// ?Patient seen and examined during rounds this am, awake/ alert , pleasant and cooperative with care.Has bilateral mittens and in no NAD. RN  at bedside administering medications  No family at bedside.  She is oriented to self. No new neurological events ? ? ?O:// ?Current vital signs: ?BP (!) 149/60   Pulse 67   Temp 98 ?F (36.7 ?C) (Oral)   Resp 16   Ht 5\' 6"  (1.676 m)   Wt 59.8 kg   SpO2 98%   BMI 21.28 kg/m?  ?Vital signs in last 24 hours: ?Temp:  [97.6 ?F (36.4 ?C)-98.5 ?F (36.9 ?C)] 98 ?F (36.7 ?C) (04/10 0741) ?Pulse Rate:  [62-67] 67 (04/10 0909) ?Resp:  [15-18] 16 (04/10 0909) ?BP: (149-161)/(57-70) 149/60 (04/10 0909) ?SpO2:  [97 %-100 %] 98 % (04/10 0909) ? ?GENERAL: Awake, alert in NAD ?HEENT: - Normocephalic and atraumatic, dry mm. Bruising on neck and right side of face ?LUNGS - Clear to auscultation bilaterally with no wheezes ?CV - S1S2 RRR, no m/r/g, equal pulses bilaterally. ?ABDOMEN - Soft, nontender, nondistended with normoactive BS ?Ext: warm, well perfused, intact peripheral pulses, +1 edema right upper arm. AV graft in right arm  ? ?NEURO:  ?Mental Status: Awake alert and oriented to self. Follows commands ?Speech: clear ?Visual: full to finger counting ?Pupils: equal and reactive ?EOM: intact  ?Corneal reflex: not tested in this awake patient ?Face: symmetric ?Cough/gag: not tested in this awake patient ?Motor: Right upper 5/5, Left upper 5/5, left lower 4/5, right lower withdraws to pain, hurts to move ?Tone: is normal and bulk is normal ?Sensation- Intact to light touch bilaterally ?Coordination: FTN intact ?Gait- deferred ? ? ?Imaging ? We have reviewed images in epic and the results pertinent to this consultation are: ? ?CT-scan of the brain 4/7 ?1. No acute intracranial abnormality. ?2. 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 ? ?rEEG 4/7: ?This study is suggestive of cortical dysfunction arising from left  frontal region consistent with prior craniotomy. Additionally there is moderate diffuse encephalopathy, nonspecific etiology. No seizures or definite epileptiform discharges were seen throughout the recording. ? ?cEEG 4/8-4/9: ? This study is suggestive of cortical dysfunction arising from left frontal region consistent with prior craniotomy. No seizures or definite epileptiform discharges were seen throughout the recording ? ?Assessment: 81 y.o. female with past medical history of end-stage renal disease on hemodialysis, hypertension, hyperlipidemia, hypothyroidism, dementia, history of brain aneurysm status-post repair, systolic congestive heart failure who presented on 09/20/2021 after a fall while trying to maneuver around the chair.  CT head at that time showed trace subarachnoid hemorrhage.  She was also noted to have right hip fracture and is status post arthroplasty on 09/22/2021. ?The morning of 4/7 patient had an episode of "shaking of her whole body" and then unresponsiveness.  Rapid response was called.  On arrival, per rapid response nurse documentation, patient was arousable and localizing to noxious stimuli. A few hours later a code blue was called for 2 episodes of unresponsiveness and generalized tonic-clonic seizure-like activity.  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  Patient was loaded with Keppra 1 g and started on 500 mg twice daily ?- DDx: New onset seizures, most likely due to old left MCA territory infarction serving as an epileptogenic focus  ?- EEG performed Friday: Continuous slow, generalized and maximal left frontal region. Breach artifact, left frontal region. This study is suggestive of cortical  dysfunction arising from the left frontal region consistent with prior craniotomy. Additionally there is moderate diffuse encephalopathy, nonspecific to etiology. No seizures or definite epileptiform discharges were seen throughout the recording. ?- Acute  encephalopathy, most likely due to post ictal state. A toxic/metabolic encephalopathy may also be contributing.  ?- CT head: Old left MCA territory infarct with ex vacuo dilatation of the frontal horn of the left lateral ventricle. Multiple vascular clips are seen at the skull base. Remote left craniotomy also noted. ? ? ?Recommendations: ?- Seizure precautions  ?- Continue Keppra 1000 mg po qd with supplemental dose of 500 mg after dialysis sessions on Tuedays, Thursdays and Saturdays   ?- PRN IV Ativan for clinical seizure ?- Management of rest of comorbidities per primary team ?- Follow up with neurology outpatient clinic for further seizure and AED management. ?- Full discharge seizure precautions listed below. ? ? ?Patient is stable and no  new focal neurological deficits noted, we will sign off at this time.Please call us back if need be. Thanks for giving [place] the opportunity in caring for this patient. ? ? ?35 minutes spent in direct care of this patient today, more than half time involved counseling and coordination of care.This includes review of chart notes, available labs and data, examination of patient, ordering of testing/medications, formulation of plan with the attending, and completion of chart documentation. This is separate from any billable procedures. More than 50% was spent in counseling the patient/family and/or coordination of care with the care team regarding the prognosis and treatment plan ? ? ?Joelyn Oms, NP-BC ?[facility] ?[phone] ? ? ?NEUROHOSPITALIST ADDENDUM ?Performed a face to face diagnostic evaluation.  ? ?I have reviewed the contents of history and physical exam as documented by PA/ARNP/Resident and agree with above documentation.  ?I have discussed and formulated the above plan as documented. Edits to the note have been made as needed. ? 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 No further seizures on Keppra. Back to her  baseline. Continue Keppra and outpatient follow up. Full discharge seizure precautions listed below. We will signoff. ? ?Donnetta Simpers, MD ?[facility] ?[phone] ?  ?If 7pm to 7am, please call on call as listed on AMION. ? ? ? ? ? ?Seizure precautions: ?Per [facility] statutes, patients with seizures are not allowed to drive until they have been seizure-free for six months and cleared by a physician  ?  ?Use caution when using heavy equipment or power tools. Avoid working on ladders or at heights. Take showers instead of baths. Ensure the water temperature is not too high on the home water heater. Do not go swimming alone. Do not lock yourself in a room alone (i.e. bathroom). When caring for infants or small children, sit down when holding, feeding, or changing them to minimize risk of injury to the child in the event you have a seizure. Maintain good sleep hygiene. Avoid alcohol.  ?  ?If patient has another seizure, call 911 and bring them back to the ED if: ?A.  The seizure lasts longer than 5 minutes.      ?B.  The patient doesn't wake shortly after the seizure or has new problems such as difficulty seeing, speaking or moving following the seizure ?C.  The patient was injured during the seizure ?D.  The patient has a temperature over 102 F (39C) ?E.  The patient vomited during the seizure and now is having trouble breathing ?   ?During the Seizure ?  ?-  First, ensure adequate ventilation and place patients on the floor on their left side  ?Loosen clothing around the neck and ensure the airway is patent. If the patient is clenching the teeth, do not force the mouth open with any object as this can cause severe damage ?- Remove all items from the surrounding that can be hazardous. The patient may be oblivious to what's happening and may not even know what he or she is doing. ?If the patient is confused and wandering, either gently guide him/her away and block access to outside areas ?-  Reassure the individual and be comforting ?- Call 911. In most cases, the seizure ends before EMS arrives. However, there are cases when seizures may last over 3 to 5 minutes. Or the individual may have developed breathing d

## 2021-09-26 NOTE — Evaluation (Signed)
Physical Therapy Evaluation ? ?Patient Details ?Name: Katherine Ponce ?MRN: [id number] ?DOB: 1941/05/13 ?Today's Date: 09/26/2021 ? ?History of Present Illness ? Pt is a 81 year old woman admitted after falling and sustaining a R proximal femur fx on 09/20/21. Underwent R hemiarthroplasty and pt WBAT with posterior hip precautions. Hospital course complicated by post op seizures. PMH: CHF, ESRD, HTN, HLD, hypothyroidism, anxiety, dementia, brain aneurysm, COPD, CAD. ?  ?Clinical Impression ? Pt admitted with above diagnosis. Pt currently with functional limitations due to the deficits listed below (see PT Problem List). At the time of PT eval pt was able to perform transfers with up to +2 assist for balance support and safety. Recommend SNF level rehab to maximize functional independence and decrease risk for falls prior to return home with family support. Per husband, they did not have a good experience at SNF last admission and he pulled her out after a week to return home with [facility] follow up instead. Acutely, pt will benefit from skilled PT to increase their independence and safety with mobility to allow discharge to the venue listed below.      ?   ? ?Recommendations for follow up therapy are one component of a multi-disciplinary discharge planning process, led by the attending physician.  Recommendations may be updated based on patient status, additional functional criteria and insurance authorization. ? ?Follow Up Recommendations Skilled nursing-short term rehab (<3 hours/day) ? ?  ?Assistance Recommended at Discharge Frequent or constant Supervision/Assistance  ?Patient can return home with the following ? Two people to help with walking and/or transfers;Assist for transportation;Help with stairs or ramp for entrance ? ?  ?Equipment Recommendations None recommended by PT  ?Recommendations for Other Services ?    ?  ?Functional Status Assessment Patient has had a recent decline in their functional status and  demonstrates the ability to make significant improvements in function in a reasonable and predictable amount of time.  ? ?  ?Precautions / Restrictions Precautions ?Precautions: Fall;Posterior Hip ?Precaution Booklet Issued: No ?Restrictions ?Weight Bearing Restrictions: Yes ?RLE Weight Bearing: Weight bearing as tolerated  ? ?  ? ?Mobility ? Bed Mobility ?Overal bed mobility: Needs Assistance ?Bed Mobility: Supine to Sit ?  ?  ?Supine to sit: +2 for physical assistance, Max assist ?  ?  ?General bed mobility comments: pt able to advance L LE toward EOB and assists in raising her trunk, assisted for R LE to pivot hips and raise trunk fully into sitting. once in sitting, pt almost immediately pushing back into trunk extension. Trunk extension active pushing vs weakness at times. ?  ? ?Transfers ?Overall transfer level: Needs assistance ?Equipment used: 2 person hand held assist ?Transfers: Sit to/from Stand, Bed to chair/wheelchair/BSC ?Sit to Stand: +2 physical assistance, Mod assist ?Stand pivot transfers: +2 physical assistance, Mod assist ?Step pivot transfers: +2 physical assistance, Mod assist ?  ?  ?  ?General transfer comment: +2 assist to rise and steady, pt able to bear some weight on R LE as she pivoted to chair. Therapist assist to facilitate weight shift. ?  ? ?Ambulation/Gait ?  ?  ?  ?  ?  ?  ?  ?General Gait Details: Unable to progress to gait training at this time. ? ?Stairs ?  ?  ?  ?  ?  ? ?Wheelchair Mobility ?  ? ?Modified Rankin (Stroke Patients Only) ?  ? ?  ? ?Balance Overall balance assessment: Needs assistance ?Sitting-balance support: Bilateral upper extremity supported, Feet supported ?Sitting balance-Leahy  Scale: Poor ?  ?  ?Standing balance support: Bilateral upper extremity supported ?Standing balance-Leahy Scale: Poor ?  ?  ?  ?  ?  ?  ?  ?  ?  ?  ?  ?  ?   ? ? ? ?Pertinent Vitals/Pain Pain Assessment ?Pain Assessment: Faces ?Faces Pain Scale: Hurts little more ?Pain Location: R LE, R  UE ?Pain Descriptors / Indicators: Discomfort, Grimacing, Guarding, Sore ?Pain Intervention(s): Limited activity within patient's tolerance, Monitored during session, Repositioned  ? ? ?Home Living Family/patient expects to be discharged to:: Private residence ?Living Arrangements: Spouse/significant other ?Available Help at Discharge: Family;Available 24 hours/day;Other (Comment);Personal care attendant (caregivers twice a week) ?Type of Home: House ?  ?  ?  ?  ?Home Layout: One level ?Home Equipment: Conservation officer, nature (2 wheels);Shower seat - built in;BSC/3in1 ?   ?  ?Prior Function Prior Level of Function : Needs assist ?  ?  ?  ?  ?  ?  ?Mobility Comments: walks with RW and constant supervision ?ADLs Comments: self feeds and grooms, assisted for all other ADLs and IADLs, caregivers help with showers x 2/week ?  ? ? ?Hand Dominance  ? Dominant Hand: Right ? ?  ?Extremity/Trunk Assessment  ? Upper Extremity Assessment ?Upper Extremity Assessment: Defer to OT evaluation ?RUE Deficits / Details: Pt not wanting to reach with R UE to rail, citing pain, appears to have HD fistula in that arm. ?RUE Coordination: decreased gross motor ?LUE Deficits / Details: generalized weakness ?  ? ?Lower Extremity Assessment ?Lower Extremity Assessment: RLE deficits/detail ?RLE Deficits / Details: Acute pain, decreased strength and AROM consistent with injury and subsequent surgery. ?  ? ?Cervical / Trunk Assessment ?Cervical / Trunk Assessment: Kyphotic;Other exceptions ?Cervical / Trunk Exceptions: truncal weakness  ?Communication  ? Communication: No difficulties  ?Cognition Arousal/Alertness: Awake/alert ?Behavior During Therapy: Flat affect ?Overall Cognitive Status: History of cognitive impairments - at baseline ?  ?  ?  ?  ?  ?  ?  ?  ?  ?  ?  ?  ?  ?  ?  ?  ?General Comments: husband provided PLOF and home info ?  ?  ? ?  ?General Comments   ? ?  ?Exercises    ? ?Assessment/Plan  ?  ?PT Assessment Patient needs continued PT  services  ?PT Problem List Decreased strength;Decreased activity tolerance;Decreased balance;Decreased mobility;Decreased knowledge of use of DME;Decreased safety awareness;Decreased knowledge of precautions;Pain ? ?   ?  ?PT Treatment Interventions DME instruction;Gait training;Functional mobility training;Therapeutic activities;Therapeutic exercise;Balance training;Patient/family education   ? ?PT Goals (Current goals can be found in the Care Plan section)  ?Acute Rehab PT Goals ?Patient Stated Goal: Be able to go home ?PT Goal Formulation: With patient/family ?Time For Goal Achievement: 10/03/21 ?Potential to Achieve Goals: Good ? ?  ?Frequency Min 3X/week ?  ? ? ?Co-evaluation PT/OT/SLP Co-Evaluation/Treatment: Yes ?Reason for Co-Treatment: Necessary to address cognition/behavior during functional activity;For patient/therapist safety;To address functional/ADL transfers ?PT goals addressed during session: Mobility/safety with mobility;Balance;Proper use of DME ?  ?  ? ? ?  ?AM-PAC PT "6 Clicks" Mobility  ?Outcome Measure Help needed turning from your back to your side while in a flat bed without using bedrails?: A Lot ?Help needed moving from lying on your back to sitting on the side of a flat bed without using bedrails?: A Lot ?Help needed moving to and from a bed to a chair (including a wheelchair)?: Total ?Help needed standing up from  a chair using your arms (e.g., wheelchair or bedside chair)?: Total ?Help needed to walk in hospital room?: Total ?Help needed climbing 3-5 steps with a railing? : Total ?6 Click Score: 8 ? ?  ?End of Session Equipment Utilized During Treatment: Gait belt ?Activity Tolerance: Patient tolerated treatment well ?Patient left: in chair;with call bell/phone within reach;with nursing/sitter in room;with family/visitor present;with chair alarm set ?Nurse Communication: Mobility status ?PT Visit Diagnosis: Unsteadiness on feet (R26.81);Pain ?Pain - Right/Left: Right ?Pain - part of  body: Hip;Leg ?  ? ?Time: 1314-3888 ?PT Time Calculation (min) (ACUTE ONLY): 30 min ? ? ?Charges:   PT Evaluation ?$PT Eval Moderate Complexity: 1 Mod ?  ?  ?   ? ? ?Rolinda Roan, PT, DPT ?Acute Rehabilitation Services

## 2021-09-26 NOTE — Progress Notes (Signed)
Pt's chart reviewed and contacted [facility] with update. Spoke to [place], clinic [facility] inquiring if pt will be able to tx in to HD chair or if hoyer lift will be needed. Clinic also voiced concern as to whether pt's husband would be able to assist pt in/out of car for HD appts since he transports pt. Advised clinic that will contact treatment team to inquire about the above and provide update to clinic once known. Clinic has a hoyer lift if needed. Pt would need to arrive to out-pt HD with hoyer pad/sling under pt in order for staff to hoyer pt from w/c to HD chair. Pt goes to out-pt HD on TTS with 10:00 arrival time. Will update clinic on d/c date once known.  ? ?Melven Sartorius ?Renal Navigator ?[phone] ?

## 2021-09-26 NOTE — Progress Notes (Signed)
?[facility] ?Progress Note  ? ?Assessment/ Plan:   ?Assessment/Plan: ?1 R hip fracture: 2/2 mechanical fall.  s/p R hemiarthroplasty with ortho 09/22/21 ?2. Seizure activity: had some activity 4/6 repeat CT head without new stroke/ worsening of SAH.  Got Ativan and Keppra.  Neurology following ?3.  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  NO surgical intervention recommended  ?4 ESRD: TTS with Dr Johny Blamer TTS schedule this week ?5 Hypertension: improved  ?6. 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 Metabolic Bone Disease: Renvela as binder, Phos 4.1 ?8  Dispo: Likely for rehab ? ?Subjective:   ? ?Patient in room with sitter.  No complaints  ? ?Objective:   ?BP (!) 149/60   Pulse 67   Temp 98 ?F (36.7 ?C) (Oral)   Resp 16   Ht _0  (1.676 m)   Wt 59.8 kg   SpO2 98%   BMI 21.28 kg/m?  ? ?Physical Exam: ?GEN sitting in bed, NAD ?HEENT bilateral orbit ecchymoses, R forehead lac ?NECK no JVD ?PULM clear anteriorly, bilateral chest rise ?CV normal rate, no rub ?ABD; soft, nondistended ?EXT no LE edema, warm and well perfused ?NEURO alert and awake but confused ?ACCESS: R AVF + T/B, accessed ? ?Labs: ?BMET ?Recent Labs  ?Lab 09/20/21 ?1809 09/20/21 ?1817 09/20/21 ?2307 09/21/21 ?2131 09/22/21 ?0145 09/22/21 ?1457 09/22/21 ?1854 09/23/21 ?0134 09/24/21 ?0143  ?NA 140 140  --  141 140 138  --  138 139  ?K 4.9 4.8  --  4.8 4.8 3.7  --  4.3 3.9  ?CL 103 102  --  104 106 98  --  101 103  ?CO2 26  --   --  27 25  --   --  25 25  ?GLUCOSE 93 95  --  85 77 71  --  86 81  ?BUN 17 22  --  31* 34* 15  --  24* 37*  ?CREATININE 3.84* 3.80*  --  5.86* 6.16* 2.90* 3.31* 3.77* 5.44*  ?CALCIUM 9.0  --   --  9.1 9.1  --   --  8.6* 8.5*  ?PHOS  --   --  4.1 6.5*  --   --   --   --   --   ? ?CBC ?Recent Labs  ?Lab 09/20/21 ?2307 09/21/21 ?2131 09/22/21 ?1854 09/23/21 ?0134 09/24/21 ?0143 09/25/21 ?0133  ?WBC 15.2*   < > 12.1* 9.1 7.1 7.5  ?NEUTROABS 13.9*  --   --   --   --   --   ?HGB 10.7*   < > 9.7* 9.3* 8.8* 9.1*  ?HCT  33.2*   < > 30.9* 29.7* 27.3* 28.0*  ?MCV 97.6   < > 97.5 97.4 95.8 95.6  ?PLT 174   < > 142* 134* 146* 147*  ? < > = values in this interval not displayed.  ? ? ?  ?Medications:   ? ? amLODipine  5 mg Oral Daily  ? atorvastatin  20 mg Oral QHS  ? chlorhexidine gluconate (MEDLINE KIT)  15 mL Mouth Rinse BID  ? Chlorhexidine Gluconate Cloth  6 each Topical Q0600  ? darbepoetin (ARANESP) injection - DIALYSIS  100 mcg Intravenous Q Sat-HD  ? docusate sodium  100 mg Oral BID  ? feeding supplement (NEPRO CARB STEADY)  237 mL Oral BID BM  ? heparin injection (subcutaneous)  5,000 Units Subcutaneous Q8H  ? hydrALAZINE  100 mg Oral Q8H  ? labetalol  300 mg Oral BID  ?  levothyroxine  75 mcg Oral QAC breakfast  ? LORazepam  2 mg Intravenous Once  ? multivitamin  1 tablet Oral QHS  ? senna  1 tablet Oral BID  ? sertraline  150 mg Oral Daily  ? sevelamer carbonate  2,400 mg Oral TID WC  ? ? ?Reesa Chew ? ?09/26/2021, 10:24 AM   ?

## 2021-09-26 NOTE — Evaluation (Signed)
Occupational Therapy Evaluation ?Patient Details ?Name: Katherine Ponce ?MRN: [id number] ?DOB: [birth year] ?Today's Date: 09/26/2021 ? ? ?History of Present Illness Pt is a 81 year old woman admitted after falling and sustaining a R proximal femur fx on 09/20/21. Underwent R hemiarthroplasty. Hospital course complicated by post op seizures. PMH: CHF, ESRD, HTN, HLD, hypothyroidism, anxiety, dementia, brain aneurysm, COPD, CAD.  ? ?Clinical Impression ?  ?Pt lives with her husband and has hired caregivers twice a week to assist with showers and IADLs. Pt walks with a RW and supervision, self feeds and grooms, she is otherwise dependent in ADLs. Pt presents with R hip and arm pain, generalized weakness, poor sitting and standing balance and baseline cognitive impairment. Per husband, pt's cognition is much improved today. Pt requires 2 person assist for all mobility and min to total assist for ADLs. Recommending SNF for further rehab. ?   ? ?Recommendations for follow up therapy are one component of a multi-disciplinary discharge planning process, led by the attending physician.  Recommendations may be updated based on patient status, additional functional criteria and insurance authorization.  ? ?Follow Up Recommendations ? Skilled nursing-short term rehab (<3 hours/day)  ?  ?Assistance Recommended at Discharge Frequent or constant Supervision/Assistance  ?Patient can return home with the following Two people to help with walking and/or transfers;Two people to help with bathing/dressing/bathroom ? ?  ?Functional Status Assessment ? Patient has had a recent decline in their functional status and demonstrates the ability to make significant improvements in function in a reasonable and predictable amount of time.  ?Equipment Recommendations ? Other (comment) (defer to next venue)  ?  ?Recommendations for Other Services   ? ? ?  ?Precautions / Restrictions Precautions ?Precautions: Fall;Posterior Hip ?Precaution Booklet Issued:  No ?Restrictions ?Weight Bearing Restrictions: Yes ?RLE Weight Bearing: Weight bearing as tolerated  ? ?  ? ?Mobility Bed Mobility ?Overal bed mobility: Needs Assistance ?Bed Mobility: Supine to Sit ?  ?  ?Supine to sit: +2 for physical assistance, Max assist ?  ?  ?General bed mobility comments: pt able to advance L LE toward EOB and assists in raising her trunk, assisted for R LE to pivot hips and raise trunk fully ?  ? ?Transfers ?Overall transfer level: Needs assistance ?Equipment used: 2 person hand held assist ?Transfers: Sit to/from Stand, Bed to chair/wheelchair/BSC ?Sit to Stand: +2 physical assistance, Mod assist ?Stand pivot transfers: +2 physical assistance, Mod assist ?  ?  ?  ?  ?General transfer comment: assist to rise and steady, pt able to bear weight on R LE as she pivoted to chair ?  ? ?  ?Balance Overall balance assessment: Needs assistance ?Sitting-balance support: Bilateral upper extremity supported, Feet supported ?Sitting balance-Leahy Scale: Poor ?  ?  ?Standing balance support: Bilateral upper extremity supported ?Standing balance-Leahy Scale: Poor ?  ?  ?  ?  ?  ?  ?  ?  ?  ?  ?  ?  ?   ? ?ADL either performed or assessed with clinical judgement  ? ?ADL Overall ADL's : Needs assistance/impaired ?Eating/Feeding: Minimal assistance;Sitting ?  ?Grooming: Total assistance;Sitting;Brushing hair ?  ?  ?  ?  ?  ?  ?  ?  ?  ?Toilet Transfer: +2 for physical assistance;Moderate assistance;Stand-pivot;BSC/3in1 ?  ?Toileting- Clothing Manipulation and Hygiene: +2 for physical assistance;Total assistance;Sit to/from stand ?  ?  ?  ?  ?General ADL Comments: pt is dependent in bathing and dressing at baseline  ? ? ? ?  Vision Ability to See in Adequate Light: 0 Adequate ?Patient Visual Report: No change from baseline ?   ?   ?Perception   ?  ?Praxis   ?  ? ?Pertinent Vitals/Pain Pain Assessment ?Pain Assessment: Faces ?Faces Pain Scale: Hurts little more ?Pain Location: R LE, R UE ?Pain Descriptors /  Indicators: Discomfort, Grimacing, Guarding, Sore ?Pain Intervention(s): Monitored during session, Repositioned  ? ? ? ?Hand Dominance Right ?  ?Extremity/Trunk Assessment Upper Extremity Assessment ?Upper Extremity Assessment: RUE deficits/detail;LUE deficits/detail ?RUE Deficits / Details: Pt not wanting to reach with R UE to rail, citing pain, appears to have HD fistula in that arm. ?RUE Coordination: decreased gross motor ?LUE Deficits / Details: generalized weakness ?  ?Lower Extremity Assessment ?Lower Extremity Assessment: Defer to PT evaluation ?  ?Cervical / Trunk Assessment ?Cervical / Trunk Assessment: Kyphotic;Other exceptions (weakness) ?  ?Communication Communication ?Communication: No difficulties ?  ?Cognition Arousal/Alertness: Awake/alert ?Behavior During Therapy: Flat affect ?Overall Cognitive Status: History of cognitive impairments - at baseline ?  ?  ?  ?  ?  ?  ?  ?  ?  ?  ?  ?  ?  ?  ?  ?  ?General Comments: husband provided PLOF and home info ?  ?  ?General Comments    ? ?  ?Exercises   ?  ?Shoulder Instructions    ? ? ?Home Living Family/patient expects to be discharged to:: Private residence ?Living Arrangements: Spouse/significant other ?Available Help at Discharge: Family;Available 24 hours/day;Other (Comment);Personal care attendant (caregivers twice a week) ?Type of Home: House ?  ?  ?  ?Home Layout: One level ?  ?  ?Bathroom Shower/Tub: Other (comment) (walk in tub) ?  ?Bathroom Toilet:  (pt uses BSC beside bed and chair) ?  ?  ?Home Equipment: Conservation officer, nature (2 wheels);Shower seat - built in;BSC/3in1 ?  ?  ?  ? ?  ?Prior Functioning/Environment Prior Level of Function : Needs assist ?  ?  ?  ?  ?  ?  ?Mobility Comments: walks with RW and constant supervision ?ADLs Comments: self feeds and grooms, assisted for all other ADLs and IADLs, caregivers help with showers x 2/week ?  ? ?  ?  ?OT Problem List: Decreased strength;Decreased activity tolerance;Impaired balance (sitting and/or  standing);Decreased coordination;Decreased cognition;Decreased safety awareness;Decreased knowledge of use of DME or AE;Pain;Decreased knowledge of precautions ?  ?   ?OT Treatment/Interventions: Self-care/ADL training;DME and/or AE instruction;Therapeutic activities;Patient/family education;Balance training;Cognitive remediation/compensation  ?  ?OT Goals(Current goals can be found in the care plan section) Acute Rehab OT Goals ?OT Goal Formulation: With patient/family ?Time For Goal Achievement: 10/10/21 ?Potential to Achieve Goals: Fair ?ADL Goals ?Pt Will Perform Eating: sitting;with supervision ?Pt Will Perform Grooming: with supervision;sitting ?Pt Will Transfer to Toilet: with mod assist;stand pivot transfer;bedside commode ?Pt Will Perform Toileting - Clothing Manipulation and hygiene: with mod assist;sit to/from stand ?Additional ADL Goal #1: Pt will demonstrate fair sitting balance at EOB in preparation for ADLs.  ?OT Frequency: Min 2X/week ?  ? ?Co-evaluation   ?  ?  ?  ?  ? ?  ?AM-PAC OT "6 Clicks" Daily Activity     ?Outcome Measure Help from another person eating meals?: A Little ?Help from another person taking care of personal grooming?: A Lot ?Help from another person toileting, which includes using toliet, bedpan, or urinal?: Total ?Help from another person bathing (including washing, rinsing, drying)?: Total ?Help from another person to put on and taking off regular upper body  clothing?: Total ?Help from another person to put on and taking off regular lower body clothing?: Total ?6 Click Score: 9 ?  ?End of Session Equipment Utilized During Treatment: Gait belt ?Nurse Communication: Mobility status ? ?Activity Tolerance: Patient tolerated treatment well ?Patient left: in chair;with call bell/phone within reach;with chair alarm set;with nursing/sitter in room;with family/visitor present ? ?OT Visit Diagnosis: Unsteadiness on feet (R26.81);Pain;Muscle weakness (generalized) (M62.81);Other symptoms  and signs involving cognitive function ?Pain - Right/Left: Right ?Pain - part of body: Hip;Arm  ?              ?Time: 3833-3832 ?OT Time Calculation (min): 31 min ?Charges:  OT General Charges ?$OT Visit: 1 Vi

## 2021-09-26 NOTE — TOC Initial Note (Signed)
Transition of Care (TOC) - Initial/Assessment Note  ? ? ?Patient Details  ?Name: Katherine Ponce ?MRN: [id number] ?Date of Birth: 1940/07/21 ? ?Transition of [facility]) CM/SW Contact:    ?Joanne Chars, LCSW ?Phone Number: ?09/26/2021, 3:06 PM ? ?Clinical Narrative:    Pt oriented x2 per notes but able to participate in assessment.  Pt husband also present, permission given to speak with husband.  Discussed SNF recommendation.  Pt was at SNF several years ago, they felt like it was a waste of time.  They were initially willing to consider SNF but as we talked they were less so.  Pt currently has HD in [place] that husband does not really want to change.  Husband is in an Transport planner but has hired several family friends to provide [facility] aide type services 2-3 x week, is willing to increase this to facilitate pt coming home rather than SNF.  MD informed.              ? ? ?Expected Discharge Plan: [place] ?Barriers to Discharge: Continued Medical Work up ? ? ?Patient Goals and CMS Choice ?Patient states their goals for this hospitalization and ongoing recovery are:: "be like I was before" ?CMS Medicare.gov Compare Post Acute Care list provided to:: Patient Represenative (must comment) ?Choice offered to / list presented to : Spouse ? ?Expected Discharge Plan and Services ?Expected Discharge Plan: [place] ?In-house Referral: Clinical Social Work ?  ?Post Acute Care Choice: Home Health ?Living arrangements for the past 2 months: [place] ?                ?  ?  ?  ?  ?  ?  ?  ?  ?  ?  ? ?Prior Living Arrangements/Services ?Living arrangements for the past 2 months: [place] ?Lives with:: Spouse ?Patient language and need for interpreter reviewed:: Yes ?Do you feel safe going back to the place where you live?: Yes      ?Need for Family Participation in Patient Care: Yes (Comment) ?Care giver support system in place?: Yes (comment) ?Current home services:  Other (comment) (husband has hired several people to assist at home with housework and with pt) ?Criminal Activity/Legal Involvement Pertinent to Current Situation/Hospitalization: No - Comment as needed ? ?Activities of Daily Living ?  ?  ? ?Permission Sought/Granted ?Permission sought to share information with : Family Supports ?Permission granted to share information with : Yes, Verbal Permission Granted ? Share Information with NAME: husband Delfino Lovett ?   ?   ?   ? ?Emotional Assessment ?Appearance:: Appears stated age ?Attitude/Demeanor/Rapport: Engaged ?Affect (typically observed): Pleasant ?Orientation: : Oriented to Self, Oriented to Place ?Alcohol / Substance Use: Not Applicable ?Psych Involvement: No (comment) ? ?Admission diagnosis:  Closed right hip fracture ([place]) [S72.001A] ?Patient Active Problem List  ? Diagnosis Date Noted  ? Preoperative cardiovascular examination   ? Subarachnoid hemorrhage following injury ([place]) 09/21/2021  ? Aortic aneurysm without rupture ([place]) 09/21/2021  ? ESRD (end stage renal disease) ([place]) 09/21/2021  ? Essential hypertension 09/21/2021  ? Hyperlipidemia 09/21/2021  ? Chronic systolic CHF (congestive heart failure) ([place]) 09/21/2021  ? Tobacco abuse 09/21/2021  ? COPD (chronic obstructive pulmonary disease) ([place]) 09/21/2021  ? Dementia without behavioral disturbance ([place]) 09/21/2021  ? Fall at home, initial encounter 09/21/2021  ? Elevated troponin 09/21/2021  ? Hypothyroidism 09/21/2021  ? Closed right hip fracture ([place]) 09/20/2021  ? ?PCP:  Yong Channel  M., MD ?Pharmacy:   ?[place], [place] ?[address] ?[place] [address] ?Phone: [phone] Fax: [phone] ? ? ? ? ?Social Determinants of Health (SDOH) Interventions ?  ? ?Readmission Risk Interventions ?   ? View : No data to display.  ?  ?  ?  ? ? ? ?

## 2021-09-26 NOTE — Procedures (Signed)
Patient Name: Katherine Ponce  ?MRN: [id number]  ?Epilepsy Attending: Lora Havens  ?Referring Physician/Provider: Kerney Elbe, MD ?Duration: 09/25/2021 1611 to 09/26/2021 8381 ?  ?Patient history: 81 y.o female with PMH of chronic systolic HF, ESRD on HD TTS, HTN, HLD, Hypothyroidism, brain aneurysm admitted with acute right hip fracture secondary to mechanical fall and small tSAH/SDH now complicated by seizure like activity. EEG to evaluate for seizure ?  ?Level of alertness: Awake, asleep ?  ?AEDs during EEG study: LEV ?  ?Technical aspects: This EEG study was done with scalp electrodes positioned according to the 10-20 International system of electrode placement. Electrical activity was acquired at a sampling rate of 500Hz  and reviewed with a high frequency filter of 70Hz  and a low frequency filter of 1Hz . EEG data were recorded continuously and digitally stored.  ?  ?Description: The posterior dominant rhythm consists of 9 Hz activity of moderate voltage (25-35 uV) seen predominantly in posterior head regions, symmetric and reactive to eye opening and eye closing. Sleep was characterized by vertex waves, sleep spindles (12 to 14 Hz), maximal frontocentral region.  EEG showed intermittent sharply contoured 6-7hz  theta slowing in left frontal region consistent with breach artifact.  ? Hyperventilation and photic stimulation were not performed.    ?  ?ABNORMALITY ?- Breach artifact, left frontal region ?  ?IMPRESSION: ?This study is suggestive of cortical dysfunction arising from left frontal region consistent with prior craniotomy. No seizures or definite epileptiform discharges were seen throughout the recording. ?  ?

## 2021-09-27 DIAGNOSIS — S72011A Unspecified intracapsular fracture of right femur, initial encounter for closed fracture: Secondary | ICD-10-CM | POA: Diagnosis not present

## 2021-09-27 DIAGNOSIS — J449 Chronic obstructive pulmonary disease, unspecified: Secondary | ICD-10-CM | POA: Diagnosis not present

## 2021-09-27 DIAGNOSIS — S72001D Fracture of unspecified part of neck of right femur, subsequent encounter for closed fracture with routine healing: Secondary | ICD-10-CM | POA: Diagnosis not present

## 2021-09-27 DIAGNOSIS — I5022 Chronic systolic (congestive) heart failure: Secondary | ICD-10-CM | POA: Diagnosis not present

## 2021-09-27 LAB — RENAL FUNCTION PANEL
Albumin: 2.4 g/dL — ABNORMAL LOW (ref 3.5–5.0)
Anion gap: 10 (ref 5–15)
BUN: 54 mg/dL — ABNORMAL HIGH (ref 8–23)
CO2: 25 mmol/L (ref 22–32)
Calcium: 9 mg/dL (ref 8.9–10.3)
Chloride: 101 mmol/L (ref 98–111)
Creatinine, Ser: 6.73 mg/dL — ABNORMAL HIGH (ref 0.44–1.00)
GFR, Estimated: 6 mL/min — ABNORMAL LOW (ref >60)
Glucose, Bld: 103 mg/dL — ABNORMAL HIGH (ref 70–99)
Phosphorus: 4.8 mg/dL — ABNORMAL HIGH (ref 2.5–4.6)
Potassium: 3.8 mmol/L (ref 3.5–5.1)
Sodium: 136 mmol/L (ref 135–145)

## 2021-09-27 LAB — CBC
HCT: 26.4 % — ABNORMAL LOW (ref 36.0–46.0)
Hemoglobin: 8.4 g/dL — ABNORMAL LOW (ref 12.0–15.0)
MCH: 30.4 pg (ref 26.0–34.0)
MCHC: 31.8 g/dL (ref 30.0–36.0)
MCV: 95.7 fL (ref 80.0–100.0)
Platelets: 172 10*3/uL (ref 150–400)
RBC: 2.76 MIL/uL — ABNORMAL LOW (ref 3.87–5.11)
RDW: 14 % (ref 11.5–15.5)
WBC: 6.8 10*3/uL (ref 4.0–10.5)
nRBC: 0 % (ref 0.0–0.2)

## 2021-09-27 LAB — IRON AND TIBC
Iron: 71 ug/dL (ref 28–170)
Saturation Ratios: 52 % — ABNORMAL HIGH (ref 10.4–31.8)
TIBC: 136 ug/dL — ABNORMAL LOW (ref 250–450)
UIBC: 65 ug/dL

## 2021-09-27 LAB — FERRITIN: Ferritin: 991 ng/mL — ABNORMAL HIGH (ref 11–307)

## 2021-09-27 MED ORDER — POLYETHYLENE GLYCOL 3350 17 G PO PACK: 17.0000 g | PACK | Freq: Every day | ORAL | 0 refills | Status: DC | PRN

## 2021-09-27 MED ORDER — AMLODIPINE BESYLATE 5 MG PO TABS
5.0000 mg | ORAL_TABLET | Freq: Once | ORAL | Status: AC
  Administered 2021-09-27: 5 mg via ORAL
  Filled 2021-09-27: qty 1

## 2021-09-27 MED ORDER — LEVETIRACETAM 1000 MG PO TABS
1000.0000 mg | ORAL_TABLET | Freq: Every day | ORAL | 1 refills | Status: DC
Start: 2021-09-27 — End: 2021-11-02

## 2021-09-27 MED ORDER — AMLODIPINE BESYLATE 5 MG PO TABS: 10.0000 mg | ORAL_TABLET | Freq: Every day | ORAL | 0 refills | Status: DC

## 2021-09-27 MED ORDER — DOCUSATE SODIUM 100 MG PO CAPS: 100.0000 mg | ORAL_CAPSULE | Freq: Two times a day (BID) | ORAL | 0 refills | Status: DC | PRN

## 2021-09-27 MED ORDER — SENNA 8.6 MG PO TABS: 1.0000 | ORAL_TABLET | Freq: Every evening | ORAL | 0 refills | Status: DC | PRN

## 2021-09-27 MED ORDER — HYDROCODONE-ACETAMINOPHEN 5-325 MG PO TABS: 1.0000 | ORAL_TABLET | Freq: Four times a day (QID) | ORAL | 0 refills | Status: DC | PRN

## 2021-09-27 MED ORDER — LEVETIRACETAM 500 MG PO TABS: 500.0000 mg | ORAL_TABLET | ORAL | 1 refills | Status: DC

## 2021-09-27 MED ORDER — ASPIRIN EC 81 MG PO TBEC: 81.0000 mg | DELAYED_RELEASE_TABLET | Freq: Two times a day (BID) | ORAL | 0 refills | Status: AC

## 2021-09-27 MED ORDER — NEPRO/CARBSTEADY PO LIQD: 237.0000 mL | Freq: Two times a day (BID) | ORAL | 0 refills | Status: AC

## 2021-09-27 NOTE — TOC Progression Note (Addendum)
Transition of Care (TOC) - Progression Note  ? ? ?Patient Details  ?Name: Katherine Ponce ?MRN: [id number] ?Date of Birth: [birth year] ? ?Transition of Care (TOC) CM/SW Contact  ?Sharin Mons, RN ?Phone Number: ?09/27/2021, 12:15 PM ? ?Clinical Narrative:    ?NCM spoke with pt's husband regarding d/c plan. Husband states pt will not transition to a SNF. States she will come home. Agreeable to home health services. Interested in [facility] providers also.Spouse without preference for home health agency. Referral made with [facility] and accepted. [facility] to assist husband with PCS as well. ?Husband states getting people in place to receive pt on tomorrow if d/c ready. States unable to receive today, MD aware. ?Pt without DME needs, already has W/C, RW, BSC. ?TOC team will continue to monitor and assist with needs..... ? ?09/27/2021  1400 Referral made with [facility] for [facility] lift and pad. Equipment will be delivered to pt's home no later than Wednesday just in case pad is needed for HD on Thursday. ? ?Expected Discharge Plan: [place] ?Barriers to Discharge: Continued Medical Work up ? ?Expected Discharge Plan and Services ?Expected Discharge Plan: [place] ?In-house Referral: Clinical Social Work ?Discharge Planning Services: CM Consult ?Post Acute Care Choice: Home Health ?Living arrangements for the past 2 months: [place] ?                ?  ?  ?  ?  ?  ?HH Arranged: RN, PT, OT, Nurse's Aide, Social Work ?[place] Agency: [place] ?Date HH Agency Contacted: 09/27/21 ?Time [place]: 2330 ?Representative spoke with at [place]: Tommi Rumps ? ? ?Social Determinants of Health (SDOH) Interventions ?  ? ?Readmission Risk Interventions ?   ? View : No data to display.  ?  ?  ?  ? ? ?

## 2021-09-27 NOTE — Progress Notes (Signed)
D/C order for tomorrow noted. Contacted pt's husband to advise him that I will contact pt's out-pt HD clinic to make them aware pt will resume on Thursday. Discussed with pt's husband how pt will be transported to/from HD. Pt's husband plans to transport as usual. Inquired if pt's husband has thought about possible w/c transport for pt at d/c. Husband states that he feels confident that he can transport pt as prior to admission. Contacted [facility] and left message for clinic social worker, Shanon Brow. Will fax d/c summary to clinic if needed for continuation of care.  ? ?Melven Sartorius ?Renal Navigator ?[phone]  ?

## 2021-09-27 NOTE — Procedures (Signed)
I was present at this dialysis session. I have reviewed the session itself and made appropriate changes.  ? ?Filed Weights  ? 09/27/21 0724 09/27/21 1103  ?Weight: 59.2 kg 58.3 kg  ? ? ?Recent Labs  ?Lab 09/27/21 ?0746  ?NA 136  ?K 3.8  ?CL 101  ?CO2 25  ?GLUCOSE 103*  ?BUN 54*  ?CREATININE 6.73*  ?CALCIUM 9.0  ?PHOS 4.8*  ? ? ?Recent Labs  ?Lab 09/20/21 ?2307 09/21/21 ?2131 09/24/21 ?0143 09/25/21 ?0133 09/27/21 ?5486  ?WBC 15.2*   < > 7.1 7.5 6.8  ?NEUTROABS 13.9*  --   --   --   --   ?HGB 10.7*   < > 8.8* 9.1* 8.4*  ?HCT 33.2*   < > 27.3* 28.0* 26.4*  ?MCV 97.6   < > 95.8 95.6 95.7  ?PLT 174   < > 146* 147* 172  ? < > = values in this interval not displayed.  ? ? ?Scheduled Meds: ? amLODipine  5 mg Oral Daily  ? atorvastatin  20 mg Oral QHS  ? chlorhexidine gluconate (MEDLINE KIT)  15 mL Mouth Rinse BID  ? Chlorhexidine Gluconate Cloth  6 each Topical Q0600  ? darbepoetin (ARANESP) injection - DIALYSIS  100 mcg Intravenous Q Sat-HD  ? docusate sodium  100 mg Oral BID  ? feeding supplement (NEPRO CARB STEADY)  237 mL Oral BID BM  ? heparin injection (subcutaneous)  5,000 Units Subcutaneous Q8H  ? hydrALAZINE  100 mg Oral Q8H  ? labetalol  300 mg Oral BID  ? levETIRAcetam  1,000 mg Oral Daily  ? levETIRAcetam  500 mg Oral Q T,Th,S,Su  ? levothyroxine  75 mcg Oral QAC breakfast  ? LORazepam  2 mg Intravenous Once  ? multivitamin  1 tablet Oral QHS  ? senna  1 tablet Oral BID  ? sertraline  150 mg Oral Daily  ? sevelamer carbonate  2,400 mg Oral TID WC  ? ?Continuous Infusions: ? sodium chloride    ? sodium chloride    ? methocarbamol (ROBAXIN) IV    ? ?PRN Meds:.sodium chloride, sodium chloride, acetaminophen, albuterol, alteplase, heparin, hydrALAZINE, HYDROcodone-acetaminophen, HYDROcodone-acetaminophen, labetalol, lidocaine (PF), lidocaine-prilocaine, menthol-cetylpyridinium **OR** phenol, methocarbamol **OR** methocarbamol (ROBAXIN) IV, ondansetron **OR** ondansetron (ZOFRAN) IV, pentafluoroprop-tetrafluoroeth,  polyethylene glycol   ?Santiago Bumpers,  MD ?09/27/2021, 12:01 PM ? ? ?

## 2021-09-27 NOTE — Progress Notes (Signed)
Physical Therapy Treatment ?Patient Details ?Name: Katherine Ponce ?MRN: [id number] ?DOB: 1941/03/03 ?Today's Date: 09/27/2021 ? ? ?History of Present Illness Pt is a 81 year old woman admitted after falling and sustaining a R proximal femur fx on 09/20/21. Underwent R hemiarthroplasty and pt WBAT with posterior hip precautions. Hospital course complicated by post op seizures. PMH: CHF, ESRD, HTN, HLD, hypothyroidism, anxiety, dementia, brain aneurysm, COPD, CAD. ? ?  ?PT Comments  ? ? Pt received supine and agreeable to session. Pt able to come to sitting EOB with mod a +1 to elevate trunk and for LE management. Once seated EOB pt with noted posterior lean and inability to maintain upright posture. Once pt with B feet on floor pt requiring max multimodal cueing to maintain midline and upright posture, able to maintain intermittently ~5 mins. PT requesting to return supine for bed pan. Pt able to roll R/L with min assist. Pt continues to benefit from skilled PT services to progress toward functional mobility goals.  ?  ?Recommendations for follow up therapy are one component of a multi-disciplinary discharge planning process, led by the attending physician.  Recommendations may be updated based on patient status, additional functional criteria and insurance authorization. ? ?Follow Up Recommendations ? Skilled nursing-short term rehab (<3 hours/day) ?  ?  ?Assistance Recommended at Discharge Frequent or constant Supervision/Assistance  ?Patient can return home with the following Two people to help with walking and/or transfers;Assist for transportation;Help with stairs or ramp for entrance ?  ?Equipment Recommendations ? None recommended by PT  ?  ?Recommendations for Other Services   ? ? ?  ?Precautions / Restrictions Precautions ?Precautions: Fall;Posterior Hip ?Precaution Booklet Issued: No ?Restrictions ?Weight Bearing Restrictions: Yes ?RLE Weight Bearing: Weight bearing as tolerated  ?  ? ?Mobility ? Bed  Mobility ?Overal bed mobility: Needs Assistance ?Bed Mobility: Supine to Sit, Sit to Supine ?  ?  ?Supine to sit: Mod assist, HOB elevated ?Sit to supine: Max assist ?  ?General bed mobility comments: pt able to advance L LE toward EOB and assists in raising her trunk, assisted for R LE to pivot hips and raise trunk fully into sitting. once in sitting, pt almost immediately pushing back into trunk extension. Trunk extension active pushing vs weakness at times. ?  ? ?Transfers ?  ?  ?  ?  ?  ?  ?  ?  ?  ?General transfer comment: pt requesting back to supine for bed pan ?  ? ?Ambulation/Gait ?  ?  ?  ?  ?  ?  ?  ?  ? ? ?Stairs ?  ?  ?  ?  ?  ? ? ?Wheelchair Mobility ?  ? ?Modified Rankin (Stroke Patients Only) ?  ? ? ?  ?Balance Overall balance assessment: Needs assistance ?Sitting-balance support: Bilateral upper extremity supported, Feet supported ?Sitting balance-Leahy Scale: Poor ?  ?  ?  ?  ?  ?  ?  ?  ?  ?  ?  ?  ?  ?  ?  ?  ?  ? ?  ?Cognition Arousal/Alertness: Awake/alert ?Behavior During Therapy: Flat affect ?Overall Cognitive Status: History of cognitive impairments - at baseline ?  ?  ?  ?  ?  ?  ?  ?  ?  ?  ?  ?  ?  ?  ?  ?  ?  ?  ?  ? ?  ?Exercises   ? ?  ?General Comments   ?  ?  ? ?  Pertinent Vitals/Pain Pain Assessment ?Pain Assessment: Faces ?Faces Pain Scale: Hurts a little bit ?Pain Location: R LE, ?Pain Descriptors / Indicators: Discomfort, Grimacing, Guarding, Sore ?Pain Intervention(s): Limited activity within patient's tolerance, Monitored during session, Repositioned  ? ? ?Home Living   ?  ?  ?  ?  ?  ?  ?  ?  ?  ?   ?  ?Prior Function    ?  ?  ?   ? ?PT Goals (current goals can now be found in the care plan section) Acute Rehab PT Goals ?Patient Stated Goal: Be able to go home ?PT Goal Formulation: With patient/family ?Time For Goal Achievement: 10/03/21 ? ?  ?Frequency ? ? ? Min 3X/week ? ? ? ?  ?PT Plan    ? ? ?Co-evaluation PT/OT/SLP Co-Evaluation/Treatment: Yes ?  ?  ?  ?  ? ?  ?AM-PAC PT  "6 Clicks" Mobility   ?Outcome Measure ? Help needed turning from your back to your side while in a flat bed without using bedrails?: A Lot ?Help needed moving from lying on your back to sitting on the side of a flat bed without using bedrails?: A Lot ?Help needed moving to and from a bed to a chair (including a wheelchair)?: Total ?Help needed standing up from a chair using your arms (e.g., wheelchair or bedside chair)?: Total ?Help needed to walk in hospital room?: Total ?Help needed climbing 3-5 steps with a railing? : Total ?6 Click Score: 8 ? ?  ?End of Session   ?Activity Tolerance: Patient tolerated treatment well ?Patient left: in bed;with call bell/phone within reach;with bed alarm set ?Nurse Communication: Mobility status ?PT Visit Diagnosis: Unsteadiness on feet (R26.81);Pain ?Pain - Right/Left: Right ?Pain - part of body: Hip;Leg ?  ? ? ?Time: 3354-5625 ?PT Time Calculation (min) (ACUTE ONLY): 21 min ? ?Charges:  $Therapeutic Activity: 8-22 mins          ?          ? ?Audry Riles. PTA ?Acute Rehabilitation Services ?Office: [phone] ? ? ? ?Betsey Holiday Kaleel Schmieder ?09/27/2021, 4:24 PM ? ?

## 2021-09-27 NOTE — Progress Notes (Signed)
?[facility] ?Progress Note  ? ?Assessment/ Plan:   ?Assessment/Plan: ?1 R hip fracture: 2/2 mechanical fall.  s/p R hemiarthroplasty with ortho 09/22/21 ?2. Seizure activity: had some activity 4/6 repeat CT head without new stroke/ worsening of SAH.  Got Ativan and Keppra.  Neurology following ?3.  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  NO surgical intervention recommended  ?4 ESRD: TTS with Dr Johny Blamer TTS schedule this week ?5 Hypertension: improved  ?6. 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 Metabolic Bone Disease: Renvela as binder, Phos Acceptable ?8  Dispo: Likely for rehab ? ?Subjective:   ? ?Patient seen on dialysis with no complaints  ? ?Objective:   ?BP (!) 175/73 (BP Location: Right Arm)   Pulse 72   Temp (!) 97.5 ?F (36.4 ?C) (Temporal)   Resp 16   Ht _0  (1.676 m)   Wt 58.3 kg   SpO2 100%   BMI 20.74 kg/m?  ? ?Physical Exam: ?GEN Lying in bed, NAD ?HEENT bilateral orbit ecchymoses, R forehead lac ?NECK no JVD ?PULM clear anteriorly, bilateral chest rise ?CV normal rate, no rub ?ABD; soft, nondistended ?EXT no LE edema, warm and well perfused ?NEURO alert and awake but confused ?ACCESS: R AVF + T/B, accessed ? ?Labs: ?BMET ?Recent Labs  ?Lab 09/20/21 ?1809 09/20/21 ?2353 09/20/21 ?2307 09/21/21 ?2131 09/22/21 ?0145 09/22/21 ?1457 09/22/21 ?1854 09/23/21 ?0134 09/24/21 ?6144 09/27/21 ?3154  ?NA 140 140  --  141 140 138  --  138 139 136  ?K 4.9 4.8  --  4.8 4.8 3.7  --  4.3 3.9 3.8  ?CL 103 102  --  104 106 98  --  101 103 101  ?CO2 26  --   --  27 25  --   --  _1 ?GLUCOSE 93 95  --  85 77 71  --  86 81 103*  ?BUN 17 22  --  31* 34* 15  --  24* 37* 54*  ?CREATININE 3.84* 3.80*  --  5.86* 6.16* 2.90* 3.31* 3.77* 5.44* 6.73*  ?CALCIUM 9.0  --   --  9.1 9.1  --   --  8.6* 8.5* 9.0  ?PHOS  --   --  4.1 6.5*  --   --   --   --   --  4.8*  ? ?CBC ?Recent Labs  ?Lab 09/20/21 ?2307 09/21/21 ?2131 09/23/21 ?0134 09/24/21 ?0143 09/25/21 ?0133 09/27/21 ?0086  ?WBC 15.2*   < > 9.1 7.1 7.5  6.8  ?NEUTROABS 13.9*  --   --   --   --   --   ?HGB 10.7*   < > 9.3* 8.8* 9.1* 8.4*  ?HCT 33.2*   < > 29.7* 27.3* 28.0* 26.4*  ?MCV 97.6   < > 97.4 95.8 95.6 95.7  ?PLT 174   < > 134* 146* 147* 172  ? < > = values in this interval not displayed.  ? ? ?  ?Medications:   ? ? amLODipine  5 mg Oral Daily  ? atorvastatin  20 mg Oral QHS  ? chlorhexidine gluconate (MEDLINE KIT)  15 mL Mouth Rinse BID  ? Chlorhexidine Gluconate Cloth  6 each Topical Q0600  ? darbepoetin (ARANESP) injection - DIALYSIS  100 mcg Intravenous Q Sat-HD  ? docusate sodium  100 mg Oral BID  ? feeding supplement (NEPRO CARB STEADY)  237 mL Oral BID BM  ? heparin injection (subcutaneous)  5,000 Units Subcutaneous Q8H  ?  hydrALAZINE  100 mg Oral Q8H  ? labetalol  300 mg Oral BID  ? levETIRAcetam  1,000 mg Oral Daily  ? levETIRAcetam  500 mg Oral Q T,Th,S,Su  ? levothyroxine  75 mcg Oral QAC breakfast  ? LORazepam  2 mg Intravenous Once  ? multivitamin  1 tablet Oral QHS  ? senna  1 tablet Oral BID  ? sertraline  150 mg Oral Daily  ? sevelamer carbonate  2,400 mg Oral TID WC  ? ? ?Reesa Chew ? ?09/27/2021, 11:59 AM   ?

## 2021-09-27 NOTE — Discharge Summary (Signed)
?Physician Discharge Summary ?  ?Patient: Katherine Ponce MRN: [id number] DOB: 09/16/1940  ?Admit date:     09/20/2021  ?Discharge date: 09/28/21  ?Discharge Physician: Hosie Poisson  ? ?PCP: Kristopher Glee., MD  ? ?Recommendations at discharge:  ?Please follow up with orthopedics in 2 weeks.  ?Please follow up with neurology in 2 weeks.  ?Please follow up with PCP in one week.  ?Please follow up with cbc and bmp in one week.  ?Please follow up with Dr Scot Dock  as scheduled in one month.  ?Please follow up with Home health as pt's husband is refusing SNF.  ? ?Discharge Diagnoses: ?Principal Problem: ?  Closed right hip fracture ([place]) ?Active Problems: ?  Subarachnoid hemorrhage following injury ([place]) ?  Aortic aneurysm without rupture ([place]) ?  ESRD (end stage renal disease) ([place]) ?  Essential hypertension ?  Hyperlipidemia ?  Chronic systolic CHF (congestive heart failure) ([place]) ?  Tobacco abuse ?  COPD (chronic obstructive pulmonary disease) ([place]) ?  Dementia without behavioral disturbance ([place]) ?  Fall at home, initial encounter ?  Elevated troponin ?  Hypothyroidism ?  Preoperative cardiovascular examination ? ? ? ?Hospital Course: ?81 year old female prior history of chronic systolic heart failure renal disease on dialysis on Tuesday Thursday and Saturday, hypertension, hyperlipidemia, hypothyroidism generalized anxiety disorder, dementia, history of brain aneurysm mild repair presented to ED with a mechanical fall. She was found to have a subcapital acute fracture of the right proximal femur, underwent surgical repair. Post op, around 4 am pt had two episodes of unresponsiveness followed by generalized tonic clonic seizures lasting less than a minute. EEG ordered and neurology consulted for recommendations. She was given a dose of IV ativan and IV keppra loading dose followed by BID IV keppra. Her seizures under control on keppra.  ?Therapy eval recommending SNF, but husband wants to take her home and transport  scheduled for 10 am on 4. 12/23 ? ?Assessment and Plan: ? ?Generalized tonic-clonic seizures followed by postictal somnolence. ?Patient received IV Ativan and IV Keppra 1 g followed by Keppra 500 mg twice daily. ?EEG was done shows continuous slow, generalized from the left frontal area where she had prior craniotomy.  She also has moderate diffuse encephalopathy of nonspecific etiology. ?Neurology consulted will wait for recommendations. ?CT of the head shows old strokes, no acute strokes seen. ?Unable to do MRI of the brain without contrast as patient has a history of brain aneurysm repair with metal clippings from [place]. ?She was started on IV Keppra 500 mg Tuesday, Thursday and Saturday, along with 1000 mg daily. Changed to oral today.  ?She was started on LTM EEG , suggestive of cortical dysfunction arising from left frontal region consistent with prior craniotomy. No seizures or def epileptiform discharges seen.  ?D/c LTM EEG . ?Pt is alert and working with PT.  ?  ?  ?  ?Acute respiratory failure with hypoxia  ?Weaned off oxygen.  ?Possibly secondary to IV Ativan/sedation ?Chest x-ray shows Stable mild cardiomegaly with suggestion of mild vascular congestion. Slight worsening hazy left base/retrocardiac opacification likely effusion/atelectasis. ?Fluid management as per HD. ?Patient afebrile and WBC count within normal limits ?  ?Acute metabolic encephalopathy probably secondary to seizures and postictal phase.   ?Pt is more alert and answering simple questions. Ammonia level wnl. Vit B 12 levels are elevated. Folate level high.  ?She is oriented to person only.  ?  ?  ?Mechanical fall ?X-ray showing  Transverse subcapital acute fracture of the right  proximal femur with varus angulation. ?Pain control. ?Orthopedics consulted plan for surgical repair as per orthopedics.  She underwent right posterior hemi hip arthroplasty.  ?Cardiology consulted for pre op clearance.  ?Bedside echo showed moderate LV  dysfunction .  ?Therapy evaluations recommending SNF, husband wants to take her home.  ?  ?Essential hypertension ?Blood pressure parameters are better controlled today.  Pt currently on norvasc 5 mg daily , hydralazine 100 mg TID, Labetalol 300 mg bid, prn IV hydralazine.  ?  ?Mechanical fall with CT of the head showing  Very small amount of subarachnoid blood over the right convexity. ?Neurosurgery consulted and recommended no further work-up.   ?  ?  ?End-stage renal disease on dialysis ?Nephrology on board  for HD, tths. ?Appreciate recommendations.  ?  ?  ?Hyperlipidemia ?Continue with statin. ?  ?  ?Generalized anxiety disorder ?Resume home meds.  ?  ?  ?  ?Dementia ?No behavioral abnormalities. ?  ?  ?  ?Anemia of chronic disease ?Stable. Normocytic.  ?Transfuse to keep hemoglobin greater than 7 ?  ?  ?Leukocytosis ?Resolved.  ?Unclear etiology. CT chest abdomen and pelvis do not show any infection.  ?  ?  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 ?Recommend outpatient follow-up with vascular surgery in one month.  ?  ?  ?Chronic combined CHF, with moderate pulmonary hypertension ?Pt appears compensated. Resume torsemide on discharge.  ?Echocardiogram reviewed .  ?  ?  ?Elevated troponin:  ?From demand ischemia and ESRD.  ?  ?  ?Hypothyroidism: ?TSH wnl. Resume synthroid.  ?Interventions: Refer to RD note for recommendations ?  ?Estimated body mass index is 21.28 kg/m? as calculated from the following: ?  Height as of this encounter: 5\' 6"  (1.676 m). ?  Weight as of this encounter: 59.8 kg. ? ? ?  ? ? ?Consultants: vascular surgery ?Cardiology ?Nephrology ?Orthopedics ?Neurology.  ?Procedures performed: hip repair.   ?Disposition: Home ?Diet recommendation:  ?Discharge Diet Orders (From admission, onward)  ? ?  Start     Ordered  ? 09/27/21 0000  Diet - low sodium heart healthy       ? 09/27/21 1139  ? ?  ?  ? ?  ? ?Renal diet ?DISCHARGE  MEDICATION: ?Allergies as of 09/27/2021   ?No Known Allergies ?  ? ?  ?Medication List  ?  ? ?STOP taking these medications   ? ?NIFEdipine 60 MG 24 hr tablet ?Commonly known as: PROCARDIA XL/NIFEDICAL XL ?  ?vitamin B-12 1000 MCG tablet ?Commonly known as: CYANOCOBALAMIN ?  ? ?  ? ?TAKE these medications   ? ?amLODipine 5 MG tablet ?Commonly known as: NORVASC ?Take 2 tablets (10 mg total) by mouth daily. ?  ?aspirin EC 81 MG tablet ?Take 1 tablet (81 mg total) by mouth in the morning and at bedtime. For DVT prophylaxis after surgery ?What changed:  ?when to take this ?additional instructions ?  ?atorvastatin 20 MG tablet ?Commonly known as: LIPITOR ?Take 20 mg by mouth at bedtime. ?  ?docusate sodium 100 MG capsule ?Commonly known as: COLACE ?Take 1 capsule (100 mg total) by mouth 2 (two) times daily as needed for mild constipation. ?  ?feeding supplement (NEPRO CARB STEADY) Liqd ?Take 237 mLs by mouth 2 (two) times daily between meals. ?  ?hydrALAZINE 100 MG tablet ?Commonly known as: APRESOLINE ?Take 100 mg by mouth 3 (three) times daily. ?  ?HYDROcodone-acetaminophen 5-325 MG tablet ?Commonly known  as: Norco ?Take 1 tablet by mouth every 6 (six) hours as needed for severe pain. ?  ?labetalol 300 MG tablet ?Commonly known as: NORMODYNE ?Take 300 mg by mouth 2 (two) times daily. ?  ?levETIRAcetam 1000 MG tablet ?Commonly known as: KEPPRA ?Take 1 tablet (1,000 mg total) by mouth daily. ?  ?levETIRAcetam 500 MG tablet ?Commonly known as: KEPPRA ?Take 1 tablet (500 mg total) by mouth every Tuesday, Thursday, Saturday, and Sunday. ?  ?levothyroxine 75 MCG tablet ?Commonly known as: SYNTHROID ?Take 75 mcg by mouth daily before breakfast. ?  ?One-A-Day Womens 50+ Tabs ?Take 1 tablet by mouth daily. ?  ?polyethylene glycol 17 g packet ?Commonly known as: MIRALAX / GLYCOLAX ?Take 17 g by mouth daily as needed for mild constipation. ?  ?senna 8.6 MG Tabs tablet ?Commonly known as: SENOKOT ?Take 1 tablet (8.6 mg total) by  mouth at bedtime as needed for mild constipation. ?  ?sertraline 100 MG tablet ?Commonly known as: ZOLOFT ?Take 150 mg by mouth daily. ?  ?sevelamer carbonate 800 MG tablet ?Commonly known as: RENVELA ?Take 2,400 mg by mouth in th

## 2021-09-28 NOTE — Progress Notes (Signed)
Pt is being discharged home with her husband. Morning and pain meds are given. VS WDL. Discharged instructions were given to the pt's husband Richard. All questions are answered. Pt's husband was advised to use PTAR services to get her home but he refused.  ?

## 2021-09-28 NOTE — Progress Notes (Signed)
Patient seen and examined.  No overnight events.  Patient tells me she feels lousy her blood pressures are low, however her blood pressure is 177/82.  Remains on room air. ?Patient is medically stable.  Discharge summary, medications done by Dr. Karleen Hampshire on 4/11 with plan to go home today with home health.  Her husband decided to take care of her at home and did not want her to go to a skilled nursing rehab.  Next dialysis is tomorrow. ? ?Discharge summary reviewed.  Med rec reviewed.  No changes are needed. ?DVT prophylaxis with aspirin 81 mg twice daily. ?Weightbearing as tolerated. ?Pain management with as needed hydrocodone. ?Orthopedics to schedule follow-up. ?Next dialysis tomorrow. ? ? ?Stable for discharge.  Can go home with home health, therapies and DME. ? ?Total time spent: 20 minutes.  Patient discharged, no charge visit. ?

## 2021-09-28 NOTE — Plan of Care (Signed)
  Problem: Education: Goal: Knowledge of General Education information will improve Description: Including pain rating scale, medication(s)/side effects and non-pharmacologic comfort measures Outcome: Progressing   Problem: Activity: Goal: Risk for activity intolerance will decrease Outcome: Progressing   Problem: Coping: Goal: Level of anxiety will decrease Outcome: Progressing   Problem: Pain Managment: Goal: General experience of comfort will improve Outcome: Progressing   Problem: Skin Integrity: Goal: Risk for impaired skin integrity will decrease Outcome: Progressing   

## 2021-09-28 NOTE — Progress Notes (Signed)
Contacted [facility] and spoke to Shanon Brow, Education officer, museum. Clinic advised pt will d/c today as planned. D/C summary and last renal note faxed to clinic for continuation of care. ? ?Melven Sartorius ?Renal Navigator ?[phone] ?

## 2021-09-28 NOTE — Progress Notes (Signed)
?[facility] ?Progress Note  ? ?Assessment/ Plan:   ?Assessment/Plan: ?1 R hip fracture: 2/2 mechanical fall.  s/p R hemiarthroplasty with ortho 09/22/21 ?2. Seizure activity: had some activity 4/6 repeat CT head without new stroke/ worsening of SAH.  Got Ativan and Keppra.  Neurology following ?3.  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  NO surgical intervention recommended  ?4 ESRD: TTS with Dr Johny Blamer TTS schedule this week; likely DC today and continue dialysis outpatient ?5 Hypertension: improved  ?6. 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 Metabolic Bone Disease: Renvela as binder, Phos Acceptable ?8  Dispo: Planning to go home with help from husband.  He will help transport to dialysis ? ?Subjective:   ? ?Patient states she feels poorly with some nausea but otherwise no complaints.  Tolerated dialysis yesterday.  Planning to go home today  ? ?Objective:   ?BP (!) 177/71 (BP Location: Left Arm) Comment: nurse notified  Pulse 72   Temp 98 ?F (36.7 ?C) (Oral)   Resp 17   Ht _0  (1.676 m)   Wt 58.3 kg   SpO2 99%   BMI 20.74 kg/m?  ? ?Physical Exam: ?GEN Lying in bed, NAD ?HEENT bilateral orbit ecchymoses, R forehead lac ?NECK no JVD ?PULM clear anteriorly, bilateral chest rise ?CV normal rate, no rub ?ABD; soft, nondistended ?EXT no LE edema, warm and well perfused ?NEURO alert and awake but confused ?ACCESS: R AVF + T/B, accessed ? ?Labs: ?BMET ?Recent Labs  ?Lab 09/21/21 ?2131 09/22/21 ?0145 09/22/21 ?1457 09/22/21 ?1854 09/23/21 ?0134 09/24/21 ?0143 09/27/21 ?2229  ?NA 141 140 138  --  138 139 136  ?K 4.8 4.8 3.7  --  4.3 3.9 3.8  ?CL 104 106 98  --  101 103 101  ?CO2 27 25  --   --  _1 ?GLUCOSE 85 77 71  --  86 81 103*  ?BUN 31* 34* 15  --  24* 37* 54*  ?CREATININE 5.86* 6.16* 2.90* 3.31* 3.77* 5.44* 6.73*  ?CALCIUM 9.1 9.1  --   --  8.6* 8.5* 9.0  ?PHOS 6.5*  --   --   --   --   --  4.8*  ? ?CBC ?Recent Labs  ?Lab 09/23/21 ?0134 09/24/21 ?0143 09/25/21 ?0133 09/27/21 ?7989  ?WBC 9.1  7.1 7.5 6.8  ?HGB 9.3* 8.8* 9.1* 8.4*  ?HCT 29.7* 27.3* 28.0* 26.4*  ?MCV 97.4 95.8 95.6 95.7  ?PLT 134* 146* 147* 172  ? ? ?  ?Medications:   ? ? amLODipine  5 mg Oral Daily  ? atorvastatin  20 mg Oral QHS  ? chlorhexidine gluconate (MEDLINE KIT)  15 mL Mouth Rinse BID  ? Chlorhexidine Gluconate Cloth  6 each Topical Q0600  ? darbepoetin (ARANESP) injection - DIALYSIS  100 mcg Intravenous Q Sat-HD  ? docusate sodium  100 mg Oral BID  ? feeding supplement (NEPRO CARB STEADY)  237 mL Oral BID BM  ? heparin injection (subcutaneous)  5,000 Units Subcutaneous Q8H  ? hydrALAZINE  100 mg Oral Q8H  ? labetalol  300 mg Oral BID  ? levETIRAcetam  1,000 mg Oral Daily  ? levETIRAcetam  500 mg Oral Q T,Th,S,Su  ? levothyroxine  75 mcg Oral QAC breakfast  ? LORazepam  2 mg Intravenous Once  ? multivitamin  1 tablet Oral QHS  ? senna  1 tablet Oral BID  ? sertraline  150 mg Oral Daily  ? sevelamer carbonate  2,400 mg Oral  TID WC  ? ? ?Reesa Chew ? ?09/28/2021, 10:17 AM   ?

## 2021-09-28 NOTE — TOC Transition Note (Signed)
Transition of Care (TOC) - CM/SW Discharge Note ? ? ?Patient Details  ?Name: Katherine Ponce ?MRN: [id number] ?Date of Birth: 05/17/1941 ? ?Transition of Care [facility]) CM/SW Contact:  ?Sharin Mons, RN ?Phone Number: [phone] ?09/28/2021, 8:06 AM ? ? ?Clinical Narrative:    ?Patient will DC to: home  ?Anticipated DC date: 09/28/2021 ?Family notified:yes ?Transport by: husband ? ? ?Per MD patient ready for DC today. RN, patient, and patient's family, and  [facility] notified of DC. DME will be delivered to pt's home today, hoyer lift and pad. ?Pt/ husband without Rx med concerns. ?Post hospital f/u noted on AVS. ? Husband to provide transportation to home. ?RNCM will sign off for now as intervention is no longer needed. Please consult [place] again if new needs arise.  ? ? ?Final next level of care: [place] ?Barriers to Discharge: No Barriers Identified ? ? ?Patient Goals and CMS Choice ?Patient states their goals for this hospitalization and ongoing recovery are:: "be like I was before" ?CMS Medicare.gov Compare Post Acute Care list provided to:: Patient Represenative (must comment) ?Choice offered to / list presented to : Spouse ? ?Discharge Placement ?  ?           ?  ?  ?  ?  ? ?Discharge Plan and Services ?In-house Referral: Clinical Social Work ?Discharge Planning Services: CM Consult ?Post Acute Care Choice: Home Health          ?  ?DME Agency: Other - Comment (hoyer lift and pad) ?Date DME Agency Contacted: 09/27/21 ?Time DME Agency Contacted: [phone] ?Representative spoke with at DME Agency: Maudie Mercury ?HH Arranged: RN, PT, OT, Nurse's Aide, Social Work ?[place] Agency: [place] ?Date HH Agency Contacted: 09/27/21 ?Time [place]: [phone] ?Representative spoke with at [place]: Tommi Rumps ? ?Social Determinants of Health (SDOH) Interventions ?  ? ? ?Readmission Risk Interventions ?   ? View : No data to display.  ?  ?  ?  ? ? ? ? ? ?

## 2021-10-03 NOTE — Addendum Note (Signed)
Addendum  created 10/03/21 0837 by Albertha Ghee, MD  ? Clinical Note Signed, Intraprocedure Blocks edited, Intraprocedure Event edited, Intraprocedure Staff edited  ?  ?

## 2021-11-02 ENCOUNTER — Ambulatory Visit (INDEPENDENT_AMBULATORY_CARE_PROVIDER_SITE_OTHER): Payer: Medicare Other | Admitting: Neurology

## 2021-11-02 ENCOUNTER — Encounter: Payer: Self-pay | Admitting: Neurology

## 2021-11-02 VITALS — BP 146/74 | HR 88

## 2021-11-02 DIAGNOSIS — G40909 Epilepsy, unspecified, not intractable, without status epilepticus: Secondary | ICD-10-CM | POA: Diagnosis not present

## 2021-11-02 DIAGNOSIS — S066XAA Traumatic subarachnoid hemorrhage with loss of consciousness status unknown, initial encounter: Secondary | ICD-10-CM

## 2021-11-02 DIAGNOSIS — F0390 Unspecified dementia without behavioral disturbance: Secondary | ICD-10-CM | POA: Diagnosis not present

## 2021-11-02 MED ORDER — LEVETIRACETAM 500 MG PO TABS: 500.0000 mg | ORAL_TABLET | Freq: Every day | ORAL | 11 refills | Status: DC

## 2021-11-02 MED ORDER — LEVETIRACETAM 250 MG PO TABS: 250.0000 mg | ORAL_TABLET | ORAL | 11 refills | Status: DC

## 2021-11-02 NOTE — Patient Instructions (Signed)
Decrease Keppra to 500 mg daily  ?Decrease Keppra to 250 mg on Tuesday, Thursday and Saturday  ?Continue your other medications  ?Follow up in 6 months  ?

## 2021-11-02 NOTE — Progress Notes (Signed)
? ?[facility] ? ?PATIENT: Katherine Ponce ?DOB: 02-01-1941 ? ?REQUESTING CLINICIAN: Kristopher Glee., MD ?HISTORY FROM: Patient and chart review  ?REASON FOR VISIT: Seizure disorder  ? ? ?HISTORICAL ? ?CHIEF COMPLAINT:  ?Chief Complaint  ?Patient presents with  ? New Patient (Initial Visit)  ?  Rm 12, with husband  ?NP/internal hospital referral for seizures, old strokes on imaging ?They would like to discuss Keppra, states it is causing severe Fatigue   ?  ? ? ?HISTORY OF PRESENT ILLNESS:  ?This is a 81 year old woman past medical history of dementia, history of brain aneurysm status postrepair, previous stroke, end-stage renal disease on Tuesday Thursday Saturday, hypertension, hyperlipidemia, hypothyroidism who is presenting after a first lifetime seizure while admitted in the hospital.  Patient presented to the ED on April 4 after a fall.  She was noted to have a right hip fracture and trace subarachnoid hemorrhage.  She was admitted for the hip fracture repair.  2 days into her admission, she was noted to have seizure-like activity described as generalized convulsion.  Neurology was consulted, EEG brain completed showing left frontal breach rhythm but no abnormal discharges.  And patient was started on Keppra.  She is on Keppra 1000 mg daily with additional 500 mg on dialysis day.  Husband reported after discharge from the hospital initially she was very tired while on the [place], still tired but improved mildly.  Since being on the medication, denies any seizure or seizure-like activity, and tolerating the medication well other than tiredness, able to participate in PT.  Patient reported she is fine, she does have a history of moderate dementia.   ? ?Handedness: Right  ? ?Onset:09/23/2021 ? ?Seizure Type: Convulsion  ? ?Current frequency: Only once  ? ?Any injuries from seizures: None  ? ?Seizure risk factors: History of stroke, aneurysm rupture, SAH ? ?Previous ASMs: None  ? ?Currenty ASMs:  Keppra 1000 daily and 500 on dialysis days.  ? ?ASMs side effects: Sleepiness, tiredness  ? ?Brain Images: Small SAH over the right convexity  ? ?Previous EEGs: Left frontal breach rhythm  ? ? ?OTHER MEDICAL CONDITIONS: History of brain aneurysm, repair, previous stroke, CHF, end-stage renal disease on dialysis Tuesday Thursday Saturday, hypertension, hyperlipidemia, hypothyroidism, dementia without abnormal behavior ? ?REVIEW OF SYSTEMS: Full 14 system review of systems performed and negative with exception of: unable to fully obtain due to dementia. ? ?ALLERGIES: ?No Known Allergies ? ?HOME MEDICATIONS: ?Outpatient Medications Prior to Visit  ?Medication Sig Dispense Refill  ? amLODipine (NORVASC) 5 MG tablet Take 2 tablets (10 mg total) by mouth daily. 30 tablet 0  ? atorvastatin (LIPITOR) 20 MG tablet Take 20 mg by mouth at bedtime.    ? docusate sodium (COLACE) 100 MG capsule Take 1 capsule (100 mg total) by mouth 2 (two) times daily as needed for mild constipation. 10 capsule 0  ? hydrALAZINE (APRESOLINE) 100 MG tablet Take 100 mg by mouth 3 (three) times daily.    ? labetalol (NORMODYNE) 300 MG tablet Take 300 mg by mouth 2 (two) times daily.    ? levothyroxine (SYNTHROID) 75 MCG tablet Take 75 mcg by mouth daily before breakfast.    ? Multiple Vitamins-Minerals (ONE-A-DAY WOMENS 50+) TABS Take 1 tablet by mouth daily.    ? polyethylene glycol (MIRALAX / GLYCOLAX) 17 g packet Take 17 g by mouth daily as needed for mild constipation. 14 each 0  ? senna (SENOKOT) 8.6 MG TABS tablet Take 1 tablet (8.6 mg total) by  mouth at bedtime as needed for mild constipation. 120 tablet 0  ? sertraline (ZOLOFT) 100 MG tablet Take 150 mg by mouth daily.    ? sevelamer carbonate (RENVELA) 800 MG tablet Take 2,400 mg by mouth in the morning and at bedtime.    ? torsemide (DEMADEX) 20 MG tablet Take 20 mg by mouth 2 (two) times daily.    ? levETIRAcetam (KEPPRA) 1000 MG tablet Take 1 tablet (1,000 mg total) by mouth daily. 30  tablet 1  ? levETIRAcetam (KEPPRA) 500 MG tablet Take 1 tablet (500 mg total) by mouth every Tuesday, Thursday, Saturday, and Sunday. 30 tablet 1  ? HYDROcodone-acetaminophen (NORCO) 5-325 MG tablet Take 1 tablet by mouth every 6 (six) hours as needed for severe pain. 20 tablet 0  ? ?No facility-administered medications prior to visit.  ? ? ?PAST MEDICAL HISTORY: ?Past Medical History:  ?Diagnosis Date  ? Anxiety   ? Carotid artery disease ([place])   ? Cerebral aneurysm   ? Chronic combined systolic and diastolic CHF (congestive heart failure) ([place])   ? COPD (chronic obstructive pulmonary disease) ([place])   ? Dementia ([place])   ? Depression   ? Emphysema of lung ([place])   ? Encephalomalacia   ? ESRD on dialysis [facility])   ? Hyperlipidemia   ? Hypertension   ? Hypothyroidism   ? Intracranial hemorrhage ([place])   ? 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 Stroke [facility])   ? ? ?PAST SURGICAL HISTORY: ?Past Surgical History:  ?Procedure Laterality Date  ? ANTERIOR APPROACH HEMI HIP ARTHROPLASTY Right 09/22/2021  ? Procedure: RIGHT POSTERIOR APPROACH HEMI HIP ARTHROPLASTY;  Surgeon: Renette Butters, MD;  Location: [facility];  Service: Orthopedics;  Laterality: Right;  ? CEREBRAL ANEURYSM REPAIR    ? ? ?FAMILY HISTORY: ?Family History  ?Problem Relation Age of Onset  ? CAD Mother   ? ? ?SOCIAL HISTORY: ?Social History  ? ?Socioeconomic History  ? Marital status: Married  ?  Spouse name: Not on file  ? Number of children: Not on file  ? Years of education: Not on file  ? Highest education level: Not on file  ?Occupational History  ? Not on file  ?Tobacco Use  ? Smoking status: Some Days  ?  Types: Cigarettes  ? Smokeless tobacco: Never  ?Substance and Sexual Activity  ? Alcohol use: Not Currently  ? Drug use: Not on file  ? Sexual activity: Not on file  ?Other Topics Concern  ? Not on file  ?Social History Narrative  ? Not on file  ? ?Social Determinants of Health  ? ?Financial Resource Strain: Not on file  ?Food Insecurity: Not on file   ?Transportation Needs: Not on file  ?Physical Activity: Not on file  ?Stress: Not on file  ?Social Connections: Not on file  ?Intimate Partner Violence: Not on file  ? ? ?PHYSICAL EXAM ? ?GENERAL EXAM/CONSTITUTIONAL: ?Vitals:  ?Vitals:  ? 11/02/21 1047  ?BP: (!) 146/74  ?Pulse: 88  ? ?There is no height or weight on file to calculate BMI. ?Wt Readings from Last 3 Encounters:  ?09/27/21 128 lb 8.5 oz (58.3 kg)  ? ?Patient is in no distress; well developed, nourished and groomed; neck is supple ? ?EYES: ?Pupils round and reactive to light, Visual fields full to confrontation, Extraocular movements intacts,  ?No results found. ? ?MUSCULOSKELETAL: ?Gait, strength, tone, movements noted in Neurologic exam below ? ?NEUROLOGIC: ?MENTAL STATUS:  ?   ? View : No data to display.  ?  ?  ?  ? ?  awake, alert, oriented to person,able to state the month and year and follow simple commands.  ? ? ?CRANIAL NERVE:  ?2nd, 3rd, 4th, 6th - pupils equal and reactive to light, visual fields full to confrontation, extraocular muscles intact, no nystagmus ?5th - facial sensation symmetric ?7th - facial strength symmetric ?8th - hearing intact ?9th - palate elevates symmetrically, uvula midline ?11th - shoulder shrug symmetric ?12th - tongue protrusion midline ? ?MOTOR:  ?normal bulk and tone, at least antigravity in the BUE, BLE ? ?SENSORY:  ?normal and symmetric to light touch ? ?COORDINATION:  ?finger-nose-finger ? ?REFLEXES:  ?deep tendon reflexes present and symmetric ? ?GAIT/STATION:  ?Deferred in her wheelchair ? ? ? ? ?DIAGNOSTIC DATA (LABS, IMAGING, TESTING) ?- I reviewed patient records, labs, notes, testing and imaging myself where available. ? ?Lab Results  ?Component Value Date  ? WBC 6.8 09/27/2021  ? HGB 8.4 (L) 09/27/2021  ? HCT 26.4 (L) 09/27/2021  ? MCV 95.7 09/27/2021  ? PLT 172 09/27/2021  ? ?   ?Component Value Date/Time  ? NA 136 09/27/2021 0746  ? K 3.8 09/27/2021 0746  ? CL 101 09/27/2021 0746  ? CO2 25 09/27/2021  0746  ? GLUCOSE 103 (H) 09/27/2021 0746  ? BUN 54 (H) 09/27/2021 0746  ? CREATININE 6.73 (H) 09/27/2021 0746  ? CALCIUM 9.0 09/27/2021 0746  ? PROT 6.0 (L) 09/20/2021 1809  ? ALBUMIN 2.4 (L) 09/27/2021 0746  ? AST

## 2022-05-08 ENCOUNTER — Ambulatory Visit: Payer: Medicare Other | Admitting: Neurology

## 2022-06-05 ENCOUNTER — Ambulatory Visit: Payer: Medicare Other | Admitting: Neurology

## 2022-07-19 ENCOUNTER — Encounter: Payer: Self-pay | Admitting: Neurology

## 2022-07-19 ENCOUNTER — Ambulatory Visit (INDEPENDENT_AMBULATORY_CARE_PROVIDER_SITE_OTHER): Payer: Medicare Other | Admitting: Neurology

## 2022-07-19 VITALS — BP 147/68 | HR 59

## 2022-07-19 DIAGNOSIS — R569 Unspecified convulsions: Secondary | ICD-10-CM | POA: Diagnosis not present

## 2022-07-19 NOTE — Patient Instructions (Signed)
Discontinue Keppra Call 911 if she has a breakthrough seizure Continue your other medications Continue to follow with PCP Return as needed.

## 2022-07-19 NOTE — Progress Notes (Signed)
[facility]  PATIENT: Katherine Ponce DOB: 1980/08/31  REQUESTING CLINICIAN: Kristopher Glee., MD HISTORY FROM: Patient and chart review  REASON FOR VISIT: Seizure disorder    HISTORICAL  CHIEF COMPLAINT:  Chief Complaint  Patient presents with   Follow-up    Rm 12, with husband States she is stable would like to d/c Keppra     INTERVAL HISTORY 07/19/2022:  Patient presents today for follow-up, she is accompanied by her husband; last visit was in May and since then she has not had any additional seizures.  She denies any side effect from the medication.  She was supposed to take Keppra 500 mg daily an additional 250 on dialysis day but patient has been taking Keppra only 250 on dialysis day prior to dialysis. She was not taking the 500 mg daily.  But again no seizures and no seizure activity and no side effect from the medication.  She would like to come off the [place].   HISTORY OF PRESENT ILLNESS:  This is a 82 year old woman past medical history of dementia, history of brain aneurysm status postrepair, previous stroke, end-stage renal disease on Tuesday Thursday Saturday, hypertension, hyperlipidemia, hypothyroidism who is presenting after a first lifetime seizure while admitted in the hospital.  Patient presented to the ED on April 4 after a fall.  She was noted to have a right hip fracture and trace subarachnoid hemorrhage.  She was admitted for the hip fracture repair.  2 days into her admission, she was noted to have seizure-like activity described as generalized convulsion.  Neurology was consulted, EEG brain completed showing left frontal breach rhythm but no abnormal discharges.  And patient was started on Keppra.  She is on Keppra 1000 mg daily with additional 500 mg on dialysis day.  Husband reported after discharge from the hospital initially she was very tired while on the [place], still tired but improved mildly.  Since being on the medication, denies any  seizure or seizure-like activity, and tolerating the medication well other than tiredness, able to participate in PT.  Patient reported she is fine, she does have a history of moderate dementia.    Handedness: Right   Onset:09/23/2021  Seizure Type: Convulsion   Current frequency: Only once   Any injuries from seizures: None   Seizure risk factors: History of stroke, aneurysm rupture, SAH  Previous ASMs: None   Currenty ASMs: Keppra 1000 daily and 500 on dialysis days.   ASMs side effects: Sleepiness, tiredness   Brain Images: Small SAH over the right convexity   Previous EEGs: Left frontal breach rhythm    OTHER MEDICAL CONDITIONS: History of brain aneurysm, repair, previous stroke, CHF, end-stage renal disease on dialysis Tuesday Thursday Saturday, hypertension, hyperlipidemia, hypothyroidism, dementia without abnormal behavior  REVIEW OF SYSTEMS: Full 14 system review of systems performed and negative with exception of: unable to fully obtain due to dementia.  ALLERGIES: No Known Allergies  HOME MEDICATIONS: Outpatient Medications Prior to Visit  Medication Sig Dispense Refill   amLODipine (NORVASC) 5 MG tablet Take 2 tablets (10 mg total) by mouth daily. 30 tablet 0   atorvastatin (LIPITOR) 20 MG tablet Take 20 mg by mouth at bedtime.     docusate sodium (COLACE) 100 MG capsule Take 1 capsule (100 mg total) by mouth 2 (two) times daily as needed for mild constipation. 10 capsule 0   hydrALAZINE (APRESOLINE) 100 MG tablet Take 100 mg by mouth 3 (three) times daily.     labetalol (NORMODYNE)  300 MG tablet Take 300 mg by mouth 2 (two) times daily.     levothyroxine (SYNTHROID) 75 MCG tablet Take 75 mcg by mouth daily before breakfast.     Multiple Vitamins-Minerals (ONE-A-DAY WOMENS 50+) TABS Take 1 tablet by mouth daily.     polyethylene glycol (MIRALAX / GLYCOLAX) 17 g packet Take 17 g by mouth daily as needed for mild constipation. 14 each 0   senna (SENOKOT) 8.6 MG TABS  tablet Take 1 tablet (8.6 mg total) by mouth at bedtime as needed for mild constipation. 120 tablet 0   sertraline (ZOLOFT) 100 MG tablet Take 150 mg by mouth daily.     sevelamer carbonate (RENVELA) 800 MG tablet Take 2,400 mg by mouth in the morning and at bedtime.     torsemide (DEMADEX) 20 MG tablet Take 20 mg by mouth 2 (two) times daily.     levETIRAcetam (KEPPRA) 250 MG tablet Take 1 tablet (250 mg total) by mouth every Tuesday, Thursday, Saturday, and Sunday. 16 tablet 11   levETIRAcetam (KEPPRA) 500 MG tablet Take 1 tablet (500 mg total) by mouth daily. 30 tablet 11   No facility-administered medications prior to visit.    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[place])    Stroke ([place])     PAST SURGICAL HISTORY: Past Surgical History:  Procedure Laterality Date   ANTERIOR APPROACH HEMI HIP ARTHROPLASTY Right 09/22/2021   Procedure: RIGHT POSTERIOR APPROACH HEMI HIP ARTHROPLASTY;  Surgeon: Renette Butters, MD;  Location: [facility];  Service: Orthopedics;  Laterality: Right;   CEREBRAL ANEURYSM REPAIR      FAMILY HISTORY: Family History  Problem Relation Age of Onset   CAD Mother     SOCIAL HISTORY: Social History   Socioeconomic History   Marital status: Married    Spouse name: Not on file   Number of children: Not on file   Years of education: Not on file   Highest education level: Not on file  Occupational History   Not on file  Tobacco Use   Smoking status: Some Days    Types: Cigarettes   Smokeless tobacco: Never   Substance and Sexual Activity   Alcohol use: Not Currently   Drug use: Not on file   Sexual activity: Not on file  Other Topics Concern   Not on file  Social History Narrative   Not on file   Social Determinants of Health   Financial Resource Strain: Not on file  Food Insecurity: Not on file  Transportation Needs: Not on file  Physical Activity: Not on file  Stress: Not on file  Social Connections: Not on file  Intimate Partner Violence: Not on file    PHYSICAL EXAM  GENERAL EXAM/CONSTITUTIONAL: Vitals:  Vitals:   07/19/22 1044  BP: (!) 147/68  Pulse: (!) 59    There is no height or weight on file to calculate BMI. Wt Readings from Last 3 Encounters:  09/27/21 128 lb 8.5 oz (  58.3 kg)   Patient is in no distress; well developed, nourished and groomed; neck is supple  EYES: Visual fields full to confrontation, Extraocular movements intacts,  No results found.  MUSCULOSKELETAL: Gait, strength, tone, movements noted in Neurologic exam below  NEUROLOGIC: MENTAL STATUS:      No data to display         awake, alert, oriented to person,able to state the month and year and follow simple commands.    CRANIAL NERVE:  2nd, 3rd, 4th, 6th - visual fields full to confrontation, extraocular muscles intact, no nystagmus 5th - facial sensation symmetric 7th - facial strength symmetric 8th - hearing intact 9th - palate elevates symmetrically, uvula midline 11th - shoulder shrug symmetric 12th - tongue protrusion midline  MOTOR:  normal bulk and tone, at least antigravity in the BUE, BLE  SENSORY:  normal and symmetric to light touch  COORDINATION:  finger-nose-finger  GAIT/STATION:  Deferred in her wheelchair     DIAGNOSTIC DATA (LABS, IMAGING, TESTING) - I reviewed patient records, labs, notes, testing and imaging myself where available.  Lab Results  Component Value Date   WBC 6.8 09/27/2021   HGB 8.4 (L) 09/27/2021   HCT 26.4 (L) 09/27/2021    MCV 95.7 09/27/2021   PLT 172 09/27/2021      Component Value Date/Time   NA 136 09/27/2021 0746   K 3.8 09/27/2021 0746   CL 101 09/27/2021 0746   CO2 25 09/27/2021 0746   GLUCOSE 103 (H) 09/27/2021 0746   BUN 54 (H) 09/27/2021 0746   CREATININE 6.73 (H) 09/27/2021 0746   CALCIUM 9.0 09/27/2021 0746   PROT 6.0 (L) 09/20/2021 1809   ALBUMIN 2.4 (L) 09/27/2021 0746   AST 36 09/20/2021 1809   ALT 12 09/20/2021 1809   ALKPHOS 93 09/20/2021 1809   BILITOT 1.1 09/20/2021 1809   GFRNONAA 6 (L) 09/27/2021 0746   No results found for: "CHOL", "HDL", "LDLCALC", "LDLDIRECT", "TRIG" No results found for: "HGBA1C" Lab Results  Component Value Date   VITAMINB12 2,284 (H) 09/24/2021   Lab Results  Component Value Date   TSH 3.951 09/20/2021    CT head 09/23/21 1. No acute intracranial abnormality. 2. 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  EEG 09/25/21 This study is suggestive of cortical dysfunction arising from left frontal region consistent with prior craniotomy. No seizures or definite epileptiform discharges were seen throughout the recording.    ASSESSMENT AND PLAN  82 y.o. year old female  with aneurysm s/p repair, previous stroke, traumatic subarachnoid hemorrhage, dementia, hypertension, hyperlipidemia who is presenting after a fall on April 4 and found to have a right hip fracture and traumatic subarachnoid hemorrhage.  During her hospitalization she was noted to have 1 generalized convulsion.  She was supposed to be on Keppra 500 mg daily an additional 250 mg on dialysis days but patient has only been taking 250 on dialysis day and prior to her dialysis.  She reports that she would like to come off the [place].  She denies any seizure or seizure-like activity, at this time since she has been taking the medication the incorrect way without any seizure, it is reasonable to stop the medication.  Advised him to call EMS if she has a breakthrough  seizure.  Continue to follow with PCP and return as needed.    1. Seizures ([place])      Patient Instructions  Discontinue Keppra Call 911 if she has a breakthrough seizure  Continue your other medications Continue to follow with PCP Return as needed.   Per [facility] statutes, patients with seizures are not allowed to drive until they have been seizure-free for six months.  Other recommendations include using caution when using heavy equipment or power tools. Avoid working on ladders or at heights. Take showers instead of baths.  Do not swim alone.  Ensure the water temperature is not too high on the home water heater. Do not go swimming alone. Do not lock yourself in a room alone (i.e. bathroom). When caring for infants or small children, sit down when holding, feeding, or changing them to minimize risk of injury to the child in the event you have a seizure. Maintain good sleep hygiene. Avoid alcohol.  Also recommend adequate sleep, hydration, good diet and minimize stress.   During the Seizure  - First, ensure adequate ventilation and place patients on the floor on their left side  Loosen clothing around the neck and ensure the airway is patent. If the patient is clenching the teeth, do not force the mouth open with any object as this can cause severe damage - Remove all items from the surrounding that can be hazardous. The patient may be oblivious to what's happening and may not even know what he or she is doing. If the patient is confused and wandering, either gently guide him/her away and block access to outside areas - Reassure the individual and be comforting - Call 911. In most cases, the seizure ends before EMS arrives. However, there are cases when seizures may last over 3 to 5 minutes. Or the individual may have developed breathing difficulties or severe injuries. If a pregnant patient or a person with diabetes develops a seizure, it is prudent to call an ambulance. -  Finally, if the patient does not regain full consciousness, then call EMS. Most patients will remain confused for about 45 to 90 minutes after a seizure, so you must use judgment in calling for help. - Avoid restraints but make sure the patient is in a bed with padded side rails - Place the individual in a lateral position with the neck slightly flexed; this will help the saliva drain from the mouth and prevent the tongue from falling backward - Remove all nearby furniture and other hazards from the area - Provide verbal assurance as the individual is regaining consciousness - Provide the patient with privacy if possible - Call for help and start treatment as ordered by the caregiver   After the Seizure (Postictal Stage)  After a seizure, most patients experience confusion, fatigue, muscle pain and/or a headache. Thus, one should permit the individual to sleep. For the next few days, reassurance is essential. Being calm and helping reorient the person is also of importance.  Most seizures are painless and end spontaneously. Seizures are not harmful to others but can lead to complications such as stress on the lungs, brain and the heart. Individuals with prior lung problems may develop labored breathing and respiratory distress.     No orders of the defined types were placed in this encounter.   No orders of the defined types were placed in this encounter.   Return if symptoms worsen or fail to improve.     Alric Ran, MD 07/19/2022, 10:59 AM  [facility] [address] [place], [place] [address] [phone]

## 2022-11-03 ENCOUNTER — Telehealth: Payer: Self-pay

## 2022-11-03 NOTE — Telephone Encounter (Signed)
(  1:06PM) TC to patient/spouse to review new PC referral. Spouse answered and requested SW outreach his daughtrer, Katherine Ponce.  (1:10 PM) SW LVM for patients daughter, Katherine Ponce.

## 2022-11-08 ENCOUNTER — Telehealth: Payer: Self-pay

## 2022-11-08 NOTE — Telephone Encounter (Signed)
PC SW outreached daughter, Stanton Kidney, returning her call. Daughter shares that they are not interested in palliative care services at this time.   Referral canceled patient will be discharged at this time.

## 2022-12-18 DEATH — deceased

## 2024-02-12 IMAGING — DX DG HIP (WITH OR WITHOUT PELVIS) 2-3V*R*
3 series · 3 of 3 positions shown · non-contrast
Comparison: Four days ago

CLINICAL DATA: Right leg pain

EXAM:
DG HIP (WITH OR WITHOUT PELVIS) 2-3V RIGHT

[pelvis ap]
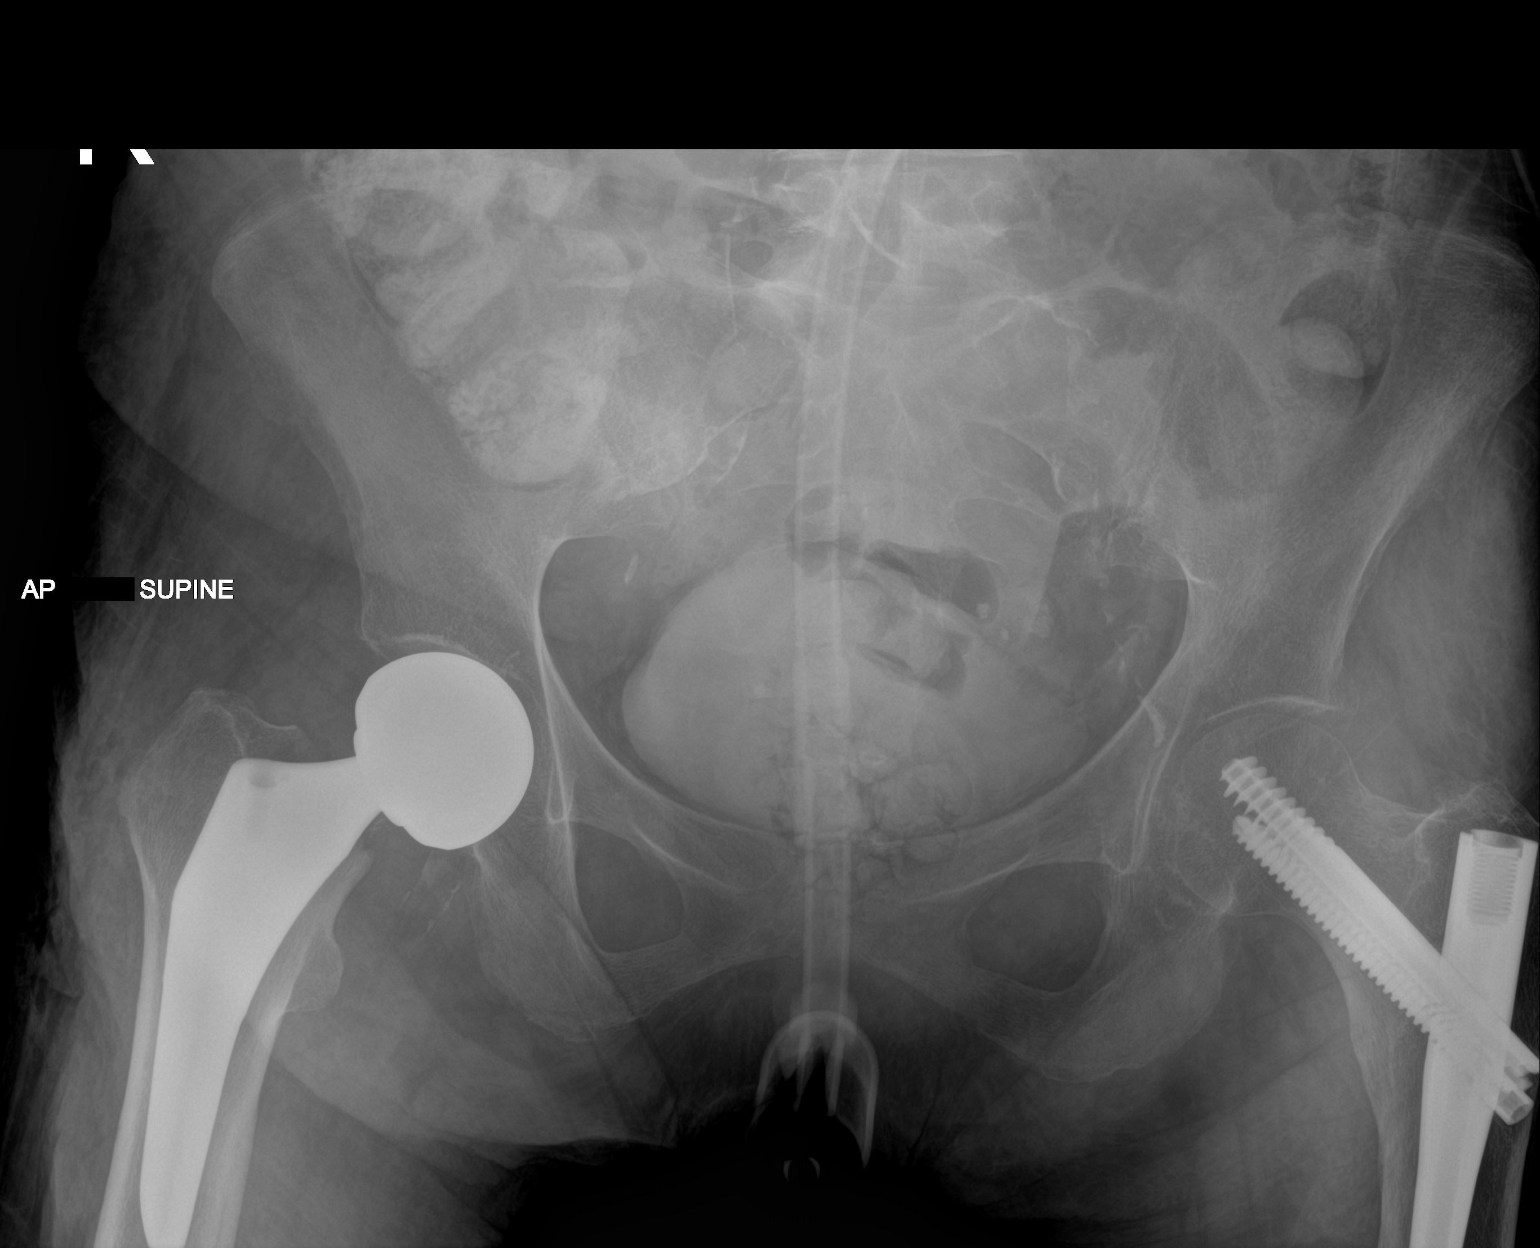

[hip ap]
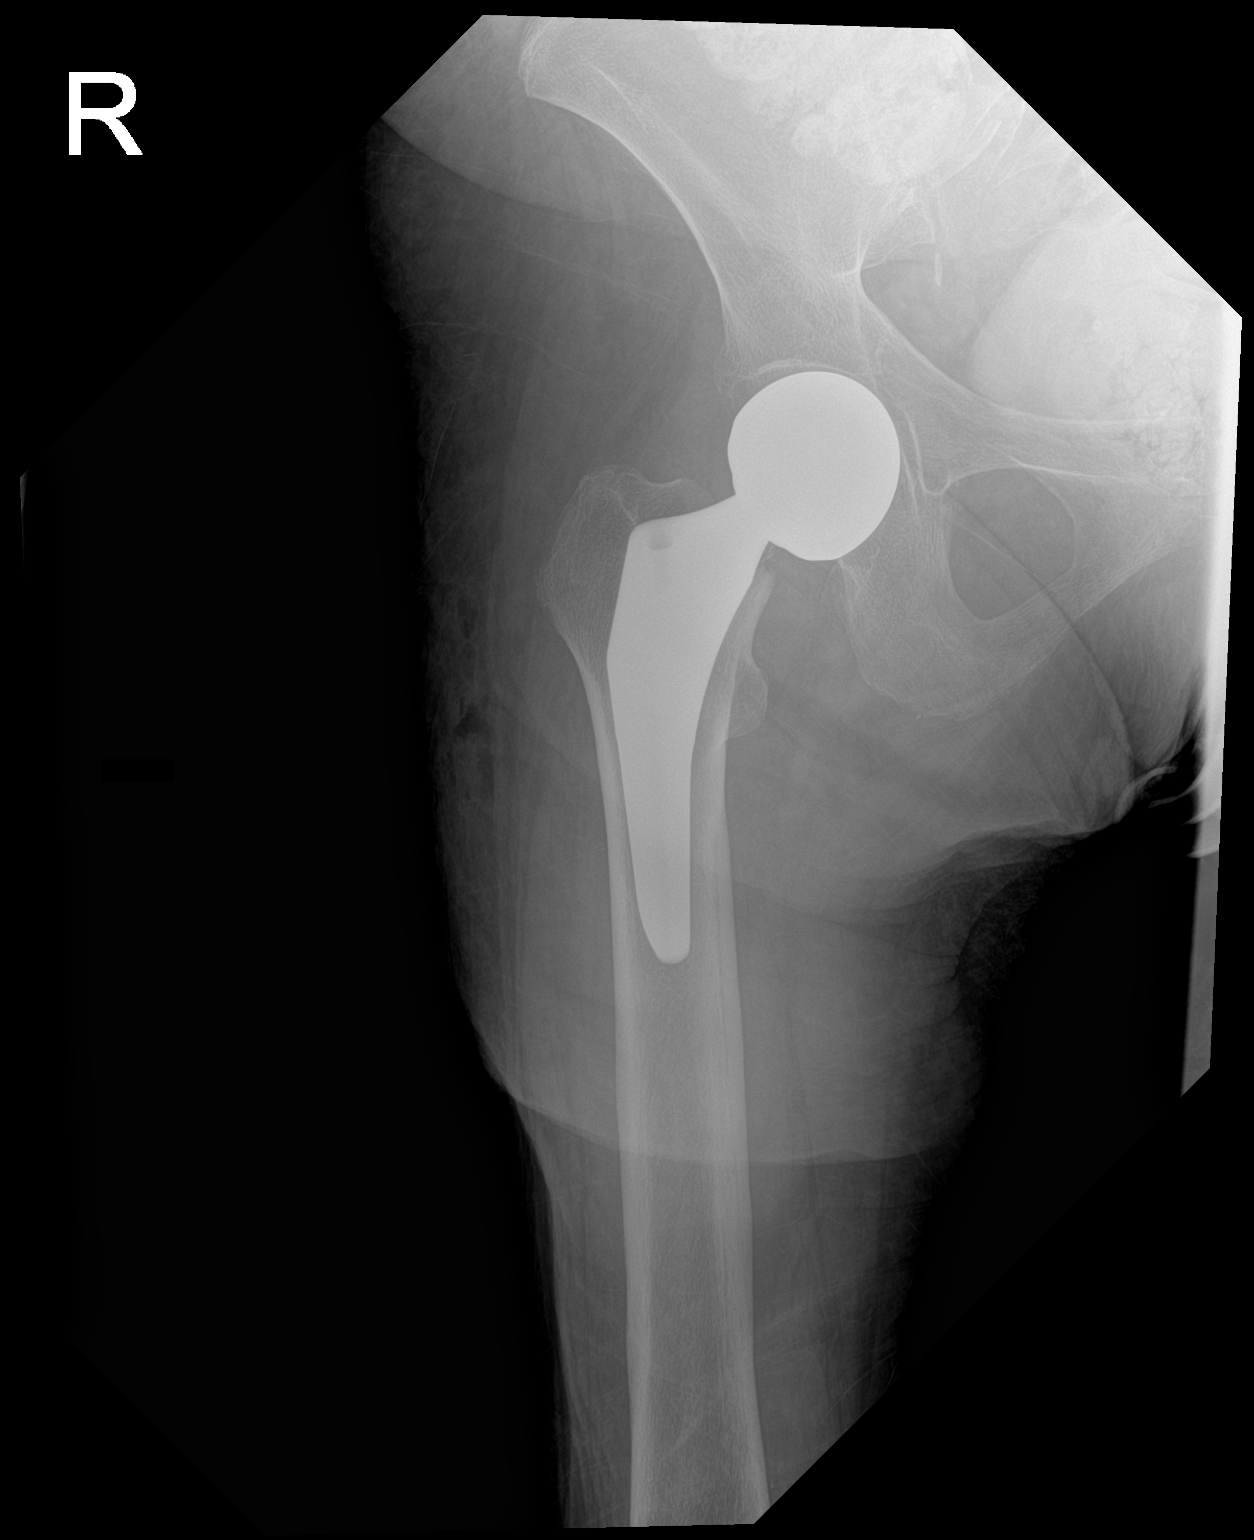

[hip lat]
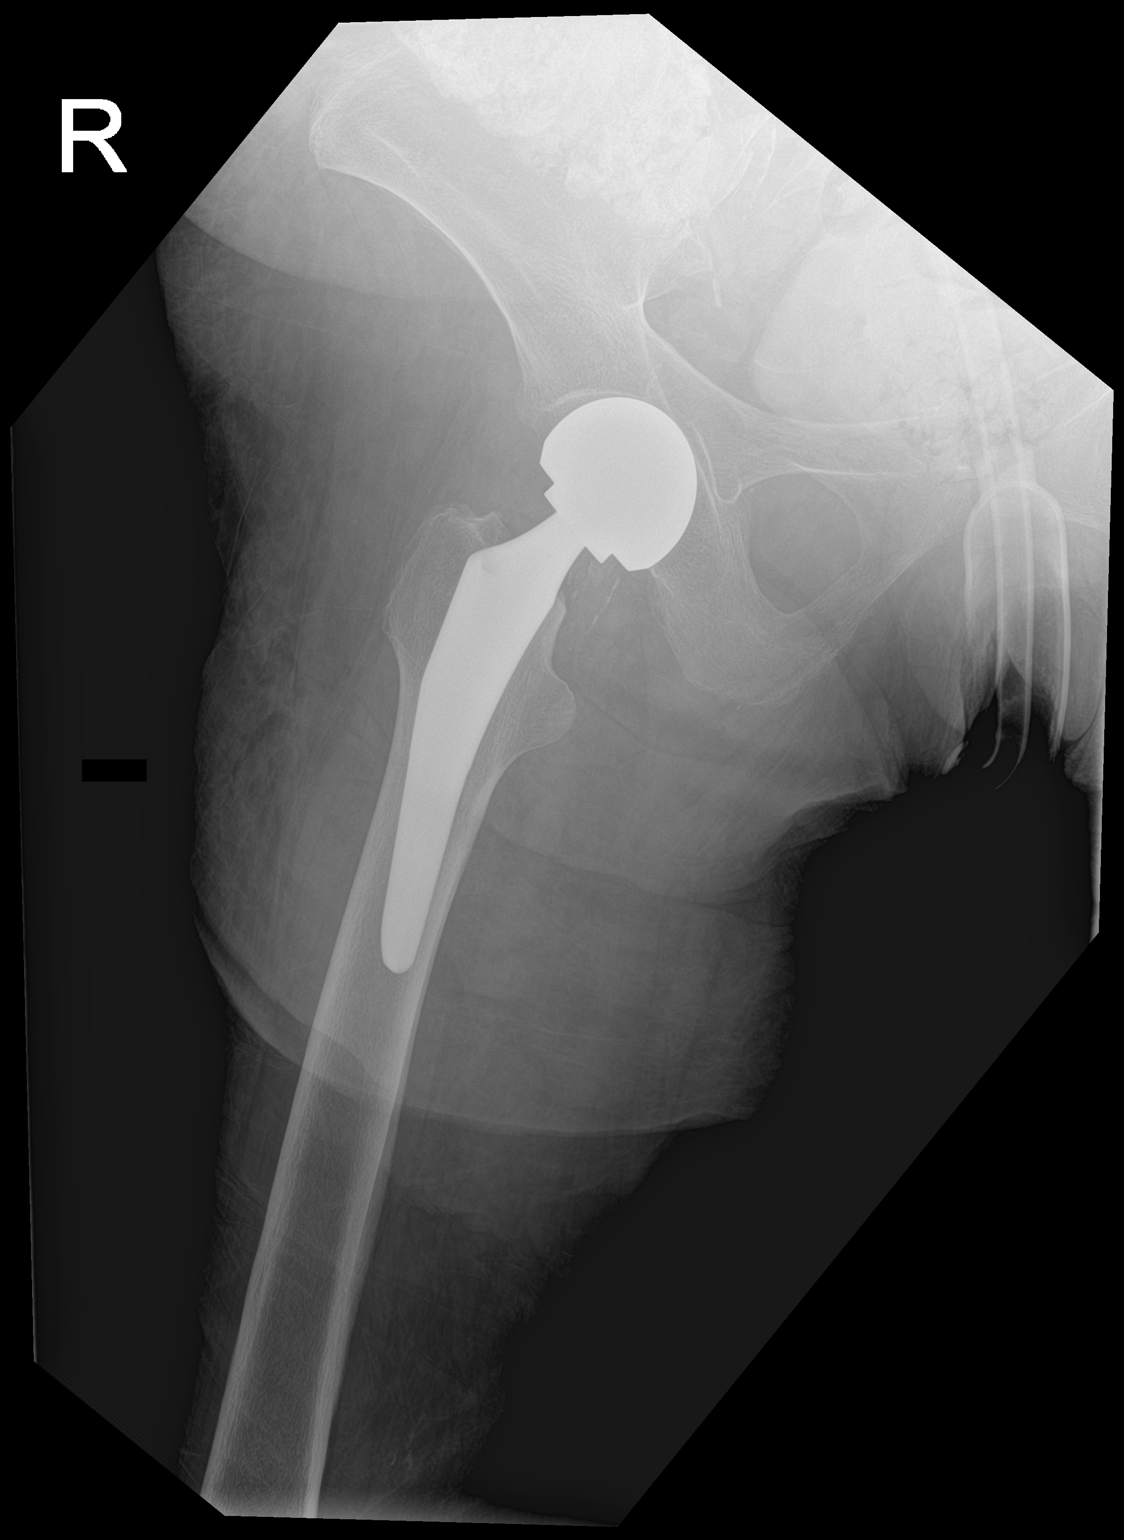

[3 of 3 positions shown; findings below may reference images not displayed]

FINDINGS: Right hip hemiarthroplasty which is located. No evidence of
periprosthetic fracture or osteolysis. Partially covered left
femoral fixation. Generalized osteopenia. No evidence of pelvic ring
fracture or diastasis.
IMPRESSION: Right hip arthroplasty without complicating features.
